# Patient Record
Sex: Male | Born: 1935 | Race: White | Hispanic: No | Marital: Married | Smoking: Former smoker
Health system: Southern US, Community
[De-identification: ages and names within clinical notes are randomized; demographics above are authoritative.]

## PROBLEM LIST (undated history)

## (undated) DIAGNOSIS — M4850XA Collapsed vertebra, not elsewhere classified, site unspecified, initial encounter for fracture: Secondary | ICD-10-CM

## (undated) DIAGNOSIS — R011 Cardiac murmur, unspecified: Secondary | ICD-10-CM

## (undated) DIAGNOSIS — R7303 Prediabetes: Secondary | ICD-10-CM

## (undated) DIAGNOSIS — K219 Gastro-esophageal reflux disease without esophagitis: Secondary | ICD-10-CM

## (undated) DIAGNOSIS — N400 Enlarged prostate without lower urinary tract symptoms: Secondary | ICD-10-CM

## (undated) DIAGNOSIS — M810 Age-related osteoporosis without current pathological fracture: Secondary | ICD-10-CM

## (undated) DIAGNOSIS — R0989 Other specified symptoms and signs involving the circulatory and respiratory systems: Secondary | ICD-10-CM

## (undated) DIAGNOSIS — I639 Cerebral infarction, unspecified: Secondary | ICD-10-CM

## (undated) DIAGNOSIS — N321 Vesicointestinal fistula: Secondary | ICD-10-CM

## (undated) DIAGNOSIS — I10 Essential (primary) hypertension: Secondary | ICD-10-CM

## (undated) DIAGNOSIS — N200 Calculus of kidney: Secondary | ICD-10-CM

## (undated) DIAGNOSIS — E785 Hyperlipidemia, unspecified: Secondary | ICD-10-CM

## (undated) HISTORY — DX: Gastro-esophageal reflux disease without esophagitis: K21.9

## (undated) HISTORY — DX: Calculus of kidney: N20.0

## (undated) HISTORY — DX: Benign prostatic hyperplasia without lower urinary tract symptoms: N40.0

## (undated) HISTORY — DX: Age-related osteoporosis without current pathological fracture: M81.0

## (undated) HISTORY — PX: TRANSURETHRAL RESECTION OF PROSTATE: SHX73

## (undated) HISTORY — DX: Prediabetes: R73.03

## (undated) HISTORY — PX: OTHER SURGICAL HISTORY: SHX169

## (undated) HISTORY — DX: Other specified symptoms and signs involving the circulatory and respiratory systems: R09.89

## (undated) HISTORY — DX: Hyperlipidemia, unspecified: E78.5

## (undated) HISTORY — DX: Vesicointestinal fistula: N32.1

## (undated) HISTORY — DX: Cardiac murmur, unspecified: R01.1

## (undated) HISTORY — PX: COLON SURGERY: SHX602

## (undated) HISTORY — DX: Essential (primary) hypertension: I10

## (undated) HISTORY — DX: Collapsed vertebra, not elsewhere classified, site unspecified, initial encounter for fracture: M48.50XA

## (undated) HISTORY — DX: Cerebral infarction, unspecified: I63.9

## (undated) HISTORY — PX: EYE SURGERY: SHX253

---

## 2005-08-14 ENCOUNTER — Ambulatory Visit: Payer: Self-pay | Admitting: Cardiology

## 2010-10-10 ENCOUNTER — Ambulatory Visit (HOSPITAL_COMMUNITY)
Admission: AD | Admit: 2010-10-10 | Discharge: 2010-10-10 | Payer: Self-pay | Source: Home / Self Care | Admitting: Urology

## 2011-03-14 LAB — CBC
HCT: 37.7 % — ABNORMAL LOW (ref 39.0–52.0)
MCV: 85.8 fL (ref 78.0–100.0)
RBC: 4.39 MIL/uL (ref 4.22–5.81)
WBC: 6.6 10*3/uL (ref 4.0–10.5)

## 2013-03-15 ENCOUNTER — Other Ambulatory Visit: Payer: Self-pay | Admitting: *Deleted

## 2013-04-02 ENCOUNTER — Other Ambulatory Visit: Payer: Self-pay | Admitting: *Deleted

## 2013-04-02 MED ORDER — MECLIZINE HCL 25 MG PO TABS: ORAL_TABLET | ORAL | Status: DC

## 2013-04-02 NOTE — Telephone Encounter (Signed)
Last seen 11/4

## 2013-05-06 ENCOUNTER — Other Ambulatory Visit: Payer: Self-pay

## 2013-05-06 NOTE — Telephone Encounter (Signed)
Last seen 01/26/13  last filled 02/19/13  If approved print and call patient to pick up

## 2013-05-07 ENCOUNTER — Ambulatory Visit (INDEPENDENT_AMBULATORY_CARE_PROVIDER_SITE_OTHER): Payer: Medicare Other | Admitting: Family Medicine

## 2013-05-07 ENCOUNTER — Encounter: Payer: Self-pay | Admitting: Family Medicine

## 2013-05-07 ENCOUNTER — Ambulatory Visit (INDEPENDENT_AMBULATORY_CARE_PROVIDER_SITE_OTHER): Payer: Medicare Other

## 2013-05-07 VITALS — BP 137/71 | HR 74 | Temp 97.0°F | Wt 152.2 lb

## 2013-05-07 DIAGNOSIS — R202 Paresthesia of skin: Secondary | ICD-10-CM

## 2013-05-07 DIAGNOSIS — R0989 Other specified symptoms and signs involving the circulatory and respiratory systems: Secondary | ICD-10-CM | POA: Insufficient documentation

## 2013-05-07 DIAGNOSIS — E785 Hyperlipidemia, unspecified: Secondary | ICD-10-CM | POA: Insufficient documentation

## 2013-05-07 DIAGNOSIS — G4700 Insomnia, unspecified: Secondary | ICD-10-CM

## 2013-05-07 DIAGNOSIS — R209 Unspecified disturbances of skin sensation: Secondary | ICD-10-CM

## 2013-05-07 DIAGNOSIS — I10 Essential (primary) hypertension: Secondary | ICD-10-CM

## 2013-05-07 DIAGNOSIS — M549 Dorsalgia, unspecified: Secondary | ICD-10-CM

## 2013-05-07 DIAGNOSIS — R7309 Other abnormal glucose: Secondary | ICD-10-CM

## 2013-05-07 DIAGNOSIS — M810 Age-related osteoporosis without current pathological fracture: Secondary | ICD-10-CM

## 2013-05-07 DIAGNOSIS — R7303 Prediabetes: Secondary | ICD-10-CM

## 2013-05-07 LAB — COMPLETE METABOLIC PANEL WITH GFR
ALT: 28 U/L (ref 0–53)
AST: 35 U/L (ref 0–37)
Albumin: 4.1 g/dL (ref 3.5–5.2)
Alkaline Phosphatase: 78 U/L (ref 39–117)
BUN: 24 mg/dL — ABNORMAL HIGH (ref 6–23)
CO2: 27 mEq/L (ref 19–32)
Calcium: 9.4 mg/dL (ref 8.4–10.5)
Chloride: 104 mEq/L (ref 96–112)
Creat: 1.17 mg/dL (ref 0.50–1.35)
GFR, Est African American: 68 mL/min
GFR, Est Non African American: 59 mL/min — ABNORMAL LOW
Glucose, Bld: 123 mg/dL — ABNORMAL HIGH (ref 70–99)
Potassium: 4.4 mEq/L (ref 3.5–5.3)
Sodium: 140 mEq/L (ref 135–145)
Total Bilirubin: 0.9 mg/dL (ref 0.3–1.2)
Total Protein: 6.5 g/dL (ref 6.0–8.3)

## 2013-05-07 LAB — FOLATE: Folate: >20 ng/mL

## 2013-05-07 LAB — TSH: TSH: 1.447 u[IU]/mL (ref 0.350–4.500)

## 2013-05-07 LAB — POCT GLYCOSYLATED HEMOGLOBIN (HGB A1C): Hemoglobin A1C: 5.8

## 2013-05-07 LAB — VITAMIN B12: Vitamin B-12: 475 pg/mL (ref 211–911)

## 2013-05-07 NOTE — Progress Notes (Signed)
Patient ID: Jose Hardin, male   DOB: Jun 01, 1930, 79 y.o.   MRN: [id number] SUBJECTIVE: HPI: Patient is here for follow up of hyperlipidemia/hypertension/right carotid bruit/prediabetes: denies Headache;denies Chest Pain;denies weakness;denies Shortness of Breath and orthopnea;denies Visual changes;denies palpitations;denies cough;denies pedal edema;denies symptoms of TIA or stroke;deniesClaudication symptoms. admits to Compliance with medications; denies Problems with medications.  Has had low back ache on and off. Has had pins and needles in the lateral side of the legs for several months.  PMH/PSH: reviewed/updated in Epic  SH/FH: reviewed/updated in Epic  Allergies: reviewed/updated in Epic  Medications: reviewed/updated in Epic  Immunizations: reviewed/updated in Epic  ROS: As above in the HPI. All other systems are stable or negative.  OBJECTIVE: APPEARANCE:  Patient in no acute distress.The patient appeared well nourished and normally developed. Acyanotic. Waist: VITAL SIGNS:BP 137/71  Pulse 74  Temp(Src) 97 F (36.1 C) (Oral)  Wt 152 lb 3.2 oz (69.037 kg) WM  SKIN: warm and  Dry without overt rashes, tattoos and scars  HEAD and Neck: without JVD, Head and scalp: normal Eyes:No scleral icterus. Fundi normal, eye movements normal. Ears: Auricle normal, canal normal, Tympanic membranes normal, insufflation normal. Nose: normal Throat: normal Neck & thyroid: normal  CHEST & LUNGS: Chest wall: normal Lungs: Clear  CVS: Reveals the PMI to be normally located. Regular rhythm, First and Second Heart sounds are normal,  absence of murmurs, rubs or gallops. Peripheral vasculature: Radial pulses: normal Dorsal pedis pulses: normal Posterior pulses: normal  ABDOMEN:  Appearance: normal Benign,, no organomegaly, no masses, no Abdominal Aortic enlargement. No Guarding , no rebound. No Bruits. Bowel sounds: normal  RECTAL: N/A GU: N/A  EXTREMETIES:  nonedematous. Both Femoral and Pedal pulses are normal.  MUSCULOSKELETAL:  Spine: FROM with mild  Discomfort in the lower lumbar spine on ROM. Joints: intact  NEUROLOGIC: oriented to time,place and person; nonfocal. Strength is normal Sensory issubjective pins and  Needles on the lateral legs bilaterally. Reflexes are normal Cranial Nerves are normal.  ASSESSMENT: Right carotid bruit - Plan: Vas Lab Arterial/Venous  HTN (hypertension) - Plan: COMPLETE METABOLIC PANEL WITH GFR  HLD (hyperlipidemia) - Plan: COMPLETE METABOLIC PANEL WITH GFR, NMR Lipoprofile with Lipids  Insomnia  Prediabetes - Plan: POCT glycosylated hemoglobin (Hb A1C), COMPLETE METABOLIC PANEL WITH GFR  Osteoporosis, unspecified  Back pain - Plan: DG Lumbar Spine 2-3 Views, NCV with EMG(electromyography)  Paresthesia of bilateral legs - Plan: Folate, Vitamin B12, TSH, DG Lumbar Spine 2-3 Views, NCV with EMG(electromyography)    PLAN:  [facility] reading (PRIMARY) by  Dr.Nhyla Nappi:Preliminary: L5 retrolisthesis with potential foraminal impingement. Await official reading.                  Orders Placed This Encounter  Procedures  . DG Lumbar Spine 2-3 Views    Standing Status: Future     Number of Occurrences: 1     Standing Expiration Date: 07/07/2014    Order Specific Question:  Reason for Exam (SYMPTOM  OR DIAGNOSIS REQUIRED)    Answer:  back pain with radiculopathy    Order Specific Question:  Preferred imaging location?    Answer:  Internal  . COMPLETE METABOLIC PANEL WITH GFR  . NMR Lipoprofile with Lipids  . Folate  . Vitamin B12  . TSH  . POCT glycosylated hemoglobin (Hb A1C)  . NCV with EMG(electromyography)    Standing Status: Future     Number of Occurrences:      Standing Expiration Date: 05/07/2014  . Vas  Lab Arterial/Venous    Standing Status: Future     Number of Occurrences:      Standing Expiration Date: 05/07/2014    Scheduling Instructions:     To do annually at [facility]  imaging.     Dx Right Carotid artery Bruit/ stenosis annual follow up    Order Specific Question:  Where should this test be performed?    Answer:  Other   Results for orders placed in visit on 05/07/13  POCT GLYCOSYLATED HEMOGLOBIN (HGB A1C)      Result Value Range   Hemoglobin A1C 5.8      Meds ordered this encounter  Medications  . alendronate (FOSAMAX) 70 MG tablet    Sig: Take 70 mg by mouth every 7 (seven) days.   Marland Kitchen amLODipine (NORVASC) 5 MG tablet    Sig: Take 5 mg by mouth daily.   Marland Kitchen atorvastatin (LIPITOR) 40 MG tablet    Sig: Take 40 mg by mouth daily.   . benazepril (LOTENSIN) 10 MG tablet    Sig: Take 10 mg by mouth daily.   . fluticasone (FLONASE) 50 MCG/ACT nasal spray    Sig: Place 1 spray into the nose daily.   . methotrexate (RHEUMATREX) 2.5 MG tablet    Sig: Take 2.5 mg by mouth. 2 tablets weekly  . omeprazole (PRILOSEC) 20 MG capsule    Sig: Take 20 mg by mouth daily.   . traMADol (ULTRAM) 50 MG tablet    Sig: Take 50 mg by mouth every 6 (six) hours as needed.   . zolpidem (AMBIEN) 10 MG tablet    Sig: Take 10 mg by mouth at bedtime as needed.    RTC in  3 months routinely.  Further interventions pending results.  Jassen Sarver P. [place] Charon, M.D.

## 2013-05-09 ENCOUNTER — Other Ambulatory Visit: Payer: Self-pay | Admitting: Family Medicine

## 2013-05-09 NOTE — Progress Notes (Signed)
Quick Note:  /Xray abnormal. Disk disease. Do the nerve conduction study. MRI lumbar spine needed. Ordered in EPIC. ______

## 2013-05-10 ENCOUNTER — Other Ambulatory Visit: Payer: Self-pay | Admitting: Family Medicine

## 2013-05-10 DIAGNOSIS — R0989 Other specified symptoms and signs involving the circulatory and respiratory systems: Secondary | ICD-10-CM

## 2013-05-10 MED ORDER — TRAMADOL HCL 50 MG PO TABS: ORAL_TABLET | ORAL | Status: DC

## 2013-05-10 NOTE — Telephone Encounter (Signed)
Printed

## 2013-05-11 ENCOUNTER — Other Ambulatory Visit: Payer: Self-pay | Admitting: Family Medicine

## 2013-05-11 DIAGNOSIS — M549 Dorsalgia, unspecified: Secondary | ICD-10-CM

## 2013-05-11 LAB — NMR LIPOPROFILE WITH LIPIDS
Cholesterol, Total: 114 mg/dL (ref <200)
HDL Particle Number: 32.2 umol/L (ref >=30.5)
HDL Size: 9.5 nm (ref >=9.2)
HDL-C: 44 mg/dL (ref >=40)
LDL (calc): 58 mg/dL (ref <100)
LDL Particle Number: 811 nmol/L (ref <1000)
LDL Size: 20.2 nm — ABNORMAL LOW (ref >20.5)
LP-IR Score: 47 — ABNORMAL HIGH (ref <=45)
Large HDL-P: 7.2 umol/L (ref >=4.8)
Large VLDL-P: 1.8 nmol/L (ref <=2.7)
Small LDL Particle Number: 456 nmol/L (ref <=527)
Triglycerides: 59 mg/dL (ref <150)
VLDL Size: 54.7 nm — ABNORMAL HIGH (ref <=46.6)

## 2013-05-11 NOTE — Progress Notes (Signed)
Patient says not to schedule the MRI on a Thursday. Thanks. FW

## 2013-05-11 NOTE — Progress Notes (Signed)
Quick Note:  Lab result at goal. No change in Medications for now. No Change in plans and follow up. ______ 

## 2013-05-12 ENCOUNTER — Ambulatory Visit (INDEPENDENT_AMBULATORY_CARE_PROVIDER_SITE_OTHER): Payer: Medicare Other

## 2013-05-12 ENCOUNTER — Ambulatory Visit: Payer: Self-pay

## 2013-05-12 ENCOUNTER — Encounter: Payer: Self-pay | Admitting: *Deleted

## 2013-05-12 DIAGNOSIS — M810 Age-related osteoporosis without current pathological fracture: Secondary | ICD-10-CM

## 2013-05-12 NOTE — Progress Notes (Signed)
Quick Note:  Letter of lab results sent to patient ______ 

## 2013-05-20 ENCOUNTER — Other Ambulatory Visit: Payer: Self-pay

## 2013-05-20 MED ORDER — FLUTICASONE PROPIONATE 50 MCG/ACT NA SUSP: 1.0000 | Freq: Every day | NASAL | Status: DC

## 2013-05-20 MED ORDER — ALENDRONATE SODIUM 70 MG PO TABS: 70.0000 mg | ORAL_TABLET | ORAL | Status: DC

## 2013-06-04 ENCOUNTER — Other Ambulatory Visit: Payer: Self-pay | Admitting: *Deleted

## 2013-06-04 MED ORDER — AMLODIPINE BESYLATE 5 MG PO TABS: 5.0000 mg | ORAL_TABLET | Freq: Every day | ORAL | Status: DC

## 2013-06-12 ENCOUNTER — Other Ambulatory Visit: Payer: Self-pay | Admitting: *Deleted

## 2013-06-12 MED ORDER — BENAZEPRIL HCL 10 MG PO TABS: 10.0000 mg | ORAL_TABLET | Freq: Every day | ORAL | Status: DC

## 2013-06-12 MED ORDER — ATORVASTATIN CALCIUM 40 MG PO TABS: 40.0000 mg | ORAL_TABLET | Freq: Every day | ORAL | Status: DC

## 2013-06-12 MED ORDER — OMEPRAZOLE 20 MG PO CPDR: 20.0000 mg | DELAYED_RELEASE_CAPSULE | Freq: Every day | ORAL | Status: DC

## 2013-07-19 ENCOUNTER — Other Ambulatory Visit: Payer: Self-pay | Admitting: *Deleted

## 2013-07-19 MED ORDER — TRAMADOL HCL 50 MG PO TABS: ORAL_TABLET | ORAL | Status: DC

## 2013-07-19 NOTE — Telephone Encounter (Signed)
LAST OV 05/07/13.

## 2013-07-19 NOTE — Telephone Encounter (Signed)
PLEASE PRINT LAST OV 05/07/13.

## 2013-08-20 ENCOUNTER — Encounter: Payer: Self-pay | Admitting: Family Medicine

## 2013-08-20 ENCOUNTER — Ambulatory Visit (INDEPENDENT_AMBULATORY_CARE_PROVIDER_SITE_OTHER): Payer: Medicare Other | Admitting: Family Medicine

## 2013-08-20 VITALS — BP 120/77 | HR 64 | Temp 97.0°F | Wt 148.4 lb

## 2013-08-20 DIAGNOSIS — M810 Age-related osteoporosis without current pathological fracture: Secondary | ICD-10-CM

## 2013-08-20 DIAGNOSIS — R011 Cardiac murmur, unspecified: Secondary | ICD-10-CM

## 2013-08-20 DIAGNOSIS — N400 Enlarged prostate without lower urinary tract symptoms: Secondary | ICD-10-CM | POA: Insufficient documentation

## 2013-08-20 DIAGNOSIS — N321 Vesicointestinal fistula: Secondary | ICD-10-CM | POA: Insufficient documentation

## 2013-08-20 DIAGNOSIS — G4700 Insomnia, unspecified: Secondary | ICD-10-CM

## 2013-08-20 DIAGNOSIS — E785 Hyperlipidemia, unspecified: Secondary | ICD-10-CM | POA: Insufficient documentation

## 2013-08-20 DIAGNOSIS — M549 Dorsalgia, unspecified: Secondary | ICD-10-CM

## 2013-08-20 DIAGNOSIS — R0989 Other specified symptoms and signs involving the circulatory and respiratory systems: Secondary | ICD-10-CM

## 2013-08-20 DIAGNOSIS — I10 Essential (primary) hypertension: Secondary | ICD-10-CM | POA: Insufficient documentation

## 2013-08-20 DIAGNOSIS — R7303 Prediabetes: Secondary | ICD-10-CM

## 2013-08-20 DIAGNOSIS — R7309 Other abnormal glucose: Secondary | ICD-10-CM

## 2013-08-20 DIAGNOSIS — K219 Gastro-esophageal reflux disease without esophagitis: Secondary | ICD-10-CM

## 2013-08-20 DIAGNOSIS — M4850XA Collapsed vertebra, not elsewhere classified, site unspecified, initial encounter for fracture: Secondary | ICD-10-CM | POA: Insufficient documentation

## 2013-08-20 DIAGNOSIS — IMO0001 Reserved for inherently not codable concepts without codable children: Secondary | ICD-10-CM

## 2013-08-20 MED ORDER — TRAMADOL HCL 50 MG PO TABS: ORAL_TABLET | ORAL | Status: DC

## 2013-08-20 NOTE — Progress Notes (Signed)
Patient ID: Jose Hardin, male   DOB: 10-Aug-1930, 79 y.o.   MRN: [id number] SUBJECTIVE: CC: Chief Complaint  Patient presents with  . Follow-up    3 month follow up   . Medication Refill    trammadol    HPI:  Patient is here for follow up of hyperlipidemia/hypertension/ osteoporosis/right carotid bruit. denies Headache;denies Chest Pain;denies weakness;denies Shortness of Breath and orthopnea;denies Visual changes;denies palpitations;denies cough;denies pedal edema;denies symptoms of TIA or stroke;deniesClaudication symptoms. admits to Compliance with medications; denies Problems with medications.  Back pain flare up needs some pain relief. 9/10. This is from the vertebral fracture in the past.   Past Medical History  Diagnosis Date  . Carotid bruit present   . Hypertension   . BPH (benign prostatic hyperplasia)   . Colovesical fistula   . Vertebral compression fracture   . Prediabetes   . Hyperlipidemia   . Osteoporosis   . Heart murmur   . GERD (gastroesophageal reflux disease)    Past Surgical History  Procedure Laterality Date  . Transurethral resection of prostate    . Colon surgery      sigmoid coloectomy  . Eye surgery      cataracts   History   Social History  . Marital Status: Single    Spouse Name: N/A    Number of Children: N/A  . Years of Education: N/A   Occupational History  . Not on file.   Social History Main Topics  . Smoking status: Former Smoker    Quit date: 12/30/1981  . Smokeless tobacco: Not on file  . Alcohol Use: Not on file  . Drug Use: Not on file  . Sexual Activity: Not on file   Other Topics Concern  . Not on file   Social History Narrative  . No narrative on file   No family history on file. Current Outpatient Prescriptions on File Prior to Visit  Medication Sig Dispense Refill  . alendronate (FOSAMAX) 70 MG tablet Take 1 tablet (70 mg total) by mouth every 7 (seven) days.  4 tablet  5  . amLODipine (NORVASC) 5 MG  tablet Take 1 tablet (5 mg total) by mouth daily.  30 tablet  2  . atorvastatin (LIPITOR) 40 MG tablet Take 1 tablet (40 mg total) by mouth daily.  30 tablet  4  . benazepril (LOTENSIN) 10 MG tablet Take 1 tablet (10 mg total) by mouth daily.  30 tablet  4  . fluticasone (FLONASE) 50 MCG/ACT nasal spray Place 1 spray into the nose daily.  16 g  4  . meclizine (ANTIVERT) 25 MG tablet Take one tablet three times daily for 10 days  30 tablet  0  . methotrexate (RHEUMATREX) 2.5 MG tablet Take 2.5 mg by mouth. 2 tablets weekly      . omeprazole (PRILOSEC) 20 MG capsule Take 1 capsule (20 mg total) by mouth daily.  30 capsule  4  . zolpidem (AMBIEN) 10 MG tablet Take 10 mg by mouth at bedtime as needed.        No current facility-administered medications on file prior to visit.   No Known Allergies Immunization History  Administered Date(s) Administered  . Pneumococcal Polysaccharide 12/30/2009  . Tdap 09/30/2011   Prior to Admission medications   Medication Sig Start Date End Date Taking? Authorizing Provider  alendronate (FOSAMAX) 70 MG tablet Take 1 tablet (70 mg total) by mouth every 7 (seven) days. 05/20/13  Yes Ileana Ladd, MD  amLODipine (NORVASC)  5 MG tablet Take 1 tablet (5 mg total) by mouth daily. 06/04/13  Yes Ileana Ladd, MD  atorvastatin (LIPITOR) 40 MG tablet Take 1 tablet (40 mg total) by mouth daily. 06/12/13  Yes Ileana Ladd, MD  benazepril (LOTENSIN) 10 MG tablet Take 1 tablet (10 mg total) by mouth daily. 06/12/13  Yes Ileana Ladd, MD  fluticasone (FLONASE) 50 MCG/ACT nasal spray Place 1 spray into the nose daily. 05/20/13  Yes Ileana Ladd, MD  meclizine (ANTIVERT) 25 MG tablet Take one tablet three times daily for 10 days 04/02/13  Yes Ileana Ladd, MD  methotrexate (RHEUMATREX) 2.5 MG tablet Take 2.5 mg by mouth. 2 tablets weekly 04/15/13  Yes Historical Provider, MD  omeprazole (PRILOSEC) 20 MG capsule Take 1 capsule (20 mg total) by mouth daily. 06/12/13  Yes  Ileana Ladd, MD  traMADol Janean Sark) 50 MG tablet Take one tablet twice a day 08/20/13  Yes Ileana Ladd, MD  zolpidem (AMBIEN) 10 MG tablet Take 10 mg by mouth at bedtime as needed.  01/28/13  Yes Historical Provider, MD     ROS: As above in the HPI. All other systems are stable or negative.  OBJECTIVE: APPEARANCE:  Patient in no acute distress.The patient appeared well nourished and normally developed. Acyanotic. Waist: VITAL SIGNS:BP 120/77  Pulse 64  Temp(Src) 97 F (36.1 C)  Wt 148 lb 6.4 oz (67.314 kg) WM  SKIN: warm and  Dry without overt rashes, tattoos and scars  HEAD and Neck: without JVD, Head and scalp: normal Eyes:No scleral icterus. Fundi normal, eye movements normal. Ears: Auricle normal, canal normal, Tympanic membranes normal, insufflation normal. Nose: normal Throat: normal Neck & thyroid: right carotid bruit  CHEST & LUNGS: Chest wall: normal Lungs: Clear  CVS: Reveals the PMI to be normally located. Regular rhythm, First and Second Heart sounds are normal,  2/6 sem at the left sternal border. Soft no change in murmurs, no rubs or gallops. Peripheral vasculature: Radial pulses: normal Dorsal pedis pulses: normal Posterior pulses: normal  ABDOMEN:  Appearance: normal Benign, no organomegaly, no masses, no Abdominal Aortic enlargement. No Guarding , no rebound. No Bruits. Bowel sounds: normal  RECTAL: N/A GU: N/A  EXTREMETIES: nonedematous.  MUSCULOSKELETAL:  Spine: reduced ROM. Pain in the lower thoracic spine area. Joints: intact  NEUROLOGIC: oriented to time,place and person; nonfocal. Strength is normal Sensory is normal Reflexes are normal Cranial Nerves are normal.  ASSESSMENT: Back pain - Plan: traMADol (ULTRAM) 50 MG tablet  Carotid bruit present  HLD (hyperlipidemia) - Plan: CMP14+EGFR, NMR, lipoprofile  HTN (hypertension) - Plan: CMP14+EGFR  Osteoporosis, unspecified - Plan: Vit D  25 hydroxy (rtn osteoporosis  monitoring), DG Bone Density  Prediabetes  Right carotid bruit  Insomnia  Vertebral compression fracture, sequela - Plan: traMADol (ULTRAM) 50 MG tablet  Heart murmur  GERD (gastroesophageal reflux disease)   PLAN: Patient says he is not yet due for follow up in [place] for his carotid bruit.  Also, attends the [place] [facility] and receives the [place] for sleep.  Orders Placed This Encounter  Procedures  . DG Bone Density    Standing Status: Future     Number of Occurrences:      Standing Expiration Date: 10/20/2014    Order Specific Question:  Reason for Exam (SYMPTOM  OR DIAGNOSIS REQUIRED)    Answer:  osteoporosis    Order Specific Question:  Preferred imaging location?    Answer:  Internal  . CMP14+EGFR  .  NMR, lipoprofile  . Vit D  25 hydroxy (rtn osteoporosis monitoring)  . POCT glycosylated hemoglobin (Hb A1C)     Meds ordered this encounter  Medications  . traMADol (ULTRAM) 50 MG tablet    Sig: Take one tablet twice a day    Dispense:  60 tablet    Refill:  0    Medications Discontinued During This Encounter  Medication Reason  . traMADol (ULTRAM) 50 MG tablet Duplicate  . traMADol (ULTRAM) 50 MG tablet Reorder    Results for orders placed in visit on 05/07/13  COMPLETE METABOLIC PANEL WITH GFR      Result Value Range   Sodium 140  135 - 145 mEq/L   Potassium 4.4  3.5 - 5.3 mEq/L   Chloride 104  96 - 112 mEq/L   CO2 27  19 - 32 mEq/L   Glucose, Bld 123 (*) 70 - 99 mg/dL   BUN 24 (*) 6 - 23 mg/dL   Creat 4.09  8.11 - 9.14 mg/dL   Total Bilirubin 0.9  0.3 - 1.2 mg/dL   Alkaline Phosphatase 78  39 - 117 U/L   AST 35  0 - 37 U/L   ALT 28  0 - 53 U/L   Total Protein 6.5  6.0 - 8.3 g/dL   Albumin 4.1  3.5 - 5.2 g/dL   Calcium 9.4  8.4 - 78.2 mg/dL   GFR, Est African American 68     GFR, Est Non African American 59 (*)   NMR LIPOPROFILE WITH LIPIDS      Result Value Range   LDL Particle Number 811  <1000 nmol/L   LDL (calc) 58  <100 mg/dL   HDL-C 44   >=95 mg/dL   Triglycerides 59  <621 mg/dL   Cholesterol, Total 308  <200 mg/dL   HDL Particle Number 65.7  >=84.6 umol/L   Large HDL-P 7.2  >=4.8 umol/L   Large VLDL-P 1.8  <=2.7 nmol/L   Small LDL Particle Number 456  <=527 nmol/L   LDL Size 20.2 (*) >20.5 nm   HDL Size 9.5  >=9.2 nm   VLDL Size 54.7 (*) <=46.6 nm   LP-IR Score 47 (*) <=45  FOLATE      Result Value Range   Folate >20.0    VITAMIN B12      Result Value Range   Vitamin B-12 475  211 - 911 pg/mL  TSH      Result Value Range   TSH 1.447  0.350 - 4.500 uIU/mL  POCT GLYCOSYLATED HEMOGLOBIN (HGB A1C)      Result Value Range   Hemoglobin A1C 5.8      Reviewed previous labs.  Reviewed meds with patient.  I could not find a follow up DEXA since 2009 and therefore will schedule one.  Return in about 4 months (around 12/20/2013) for Recheck medical problems.  Lousie Calico P. [place] Charon, M.D.

## 2013-08-23 LAB — CMP14+EGFR
ALT: 23 IU/L (ref 0–44)
AST: 34 IU/L (ref 0–40)
Albumin/Globulin Ratio: 2.2 (ref 1.1–2.5)
Albumin: 4.3 g/dL (ref 3.5–4.7)
Alkaline Phosphatase: 72 IU/L (ref 39–117)
BUN/Creatinine Ratio: 17 (ref 10–22)
BUN: 19 mg/dL (ref 8–27)
CO2: 26 mmol/L (ref 18–29)
Calcium: 9.3 mg/dL (ref 8.6–10.2)
Chloride: 102 mmol/L (ref 97–108)
Creatinine, Ser: 1.15 mg/dL (ref 0.76–1.27)
GFR calc Af Amer: 69 mL/min/{1.73_m2} (ref >59)
GFR calc non Af Amer: 60 mL/min/{1.73_m2} (ref >59)
Globulin, Total: 2 g/dL (ref 1.5–4.5)
Glucose: 123 mg/dL — ABNORMAL HIGH (ref 65–99)
Potassium: 4.3 mmol/L (ref 3.5–5.2)
Sodium: 140 mmol/L (ref 134–144)
Total Bilirubin: 0.8 mg/dL (ref 0.0–1.2)
Total Protein: 6.3 g/dL (ref 6.0–8.5)

## 2013-08-23 LAB — VITAMIN D 25 HYDROXY (VIT D DEFICIENCY, FRACTURES): Vit D, 25-Hydroxy: 49.9 ng/mL (ref 30.0–100.0)

## 2013-08-23 LAB — NMR, LIPOPROFILE
Cholesterol: 114 mg/dL (ref <200)
HDL Cholesterol by NMR: 50 mg/dL (ref >=40)
HDL Particle Number: 32.7 umol/L (ref >=30.5)
LDL Particle Number: 860 nmol/L (ref <1000)
LDL Size: 21.1 nm (ref >20.5)
LDLC SERPL CALC-MCNC: 53 mg/dL (ref <100)
LP-IR Score: <25 (ref <=45)
Small LDL Particle Number: <90 nmol/L (ref <=527)
Triglycerides by NMR: 56 mg/dL (ref <150)

## 2013-08-27 NOTE — Progress Notes (Signed)
Quick Note:  Labs abnormal.The HGBA1C is missing!!!! The Rest of the labs Seem to be at goal pending the HGBA1C ______

## 2013-09-02 LAB — POCT GLYCOSYLATED HEMOGLOBIN (HGB A1C): Hemoglobin A1C: 5.7

## 2013-09-03 ENCOUNTER — Other Ambulatory Visit: Payer: Self-pay | Admitting: *Deleted

## 2013-09-03 MED ORDER — MECLIZINE HCL 25 MG PO TABS: ORAL_TABLET | ORAL | Status: DC

## 2013-09-29 ENCOUNTER — Ambulatory Visit (INDEPENDENT_AMBULATORY_CARE_PROVIDER_SITE_OTHER): Payer: Medicare Other | Admitting: Pharmacist

## 2013-09-29 ENCOUNTER — Ambulatory Visit (INDEPENDENT_AMBULATORY_CARE_PROVIDER_SITE_OTHER): Payer: Medicare Other

## 2013-09-29 ENCOUNTER — Encounter: Payer: Self-pay | Admitting: Pharmacist

## 2013-09-29 VITALS — Ht 69.5 in | Wt 154.0 lb

## 2013-09-29 DIAGNOSIS — M810 Age-related osteoporosis without current pathological fracture: Secondary | ICD-10-CM

## 2013-09-29 DIAGNOSIS — Z23 Encounter for immunization: Secondary | ICD-10-CM

## 2013-09-29 MED ORDER — LIDOCAINE 5 % EX PTCH: MEDICATED_PATCH | CUTANEOUS | Status: DC

## 2013-09-29 NOTE — Progress Notes (Signed)
Patient ID: Jose Hardin, male   DOB: 10/09/1930, 79 y.o.   MRN: [id number] [facility] Current Height: Height: 5' 9.5" (176.5 cm)      Max Lifetime Height:  5\' 11"  Current Weight: Weight: 154 lb (69.854 kg)       Ethnicity:Caucasian    HPI: Does pt already have a diagnosis of:  Osteopenia?  Yes Osteoporosis?  Yes  Back Pain?  Yes       Kyphosis?  Yes Prior fracture?  Yes vertebral fracture in 1960's when power pole fell on him at work Per Lumbar Xray from 04/2013 - Anterior wedge compression deformity T12 (?new vs. Old fracture)  Med(s) for Osteoporosis/Osteopenia:  Fosamax 70mg  1 tablet weekly Med(s) previously tried for Osteoporosis/Osteopenia:  none                                                            PMH: Age at menopause:  n/a  Steroid Use?  No Thyroid med?  No History of cancer?  No History of digestive disorders (ie Crohn's)?  Yes - GERD on PPI Current or previous eating disorders?  No Last Vitamin D Result:  56 (12/2012) Last GFR Result:  60 (08/20/2013)   FH/SH: Family history of osteoporosis?  No Parent with history of hip fracture?  No Exercise?  No - active lifestyle, does all housework and up keep to home Smoking?  No Alcohol?  No    Calcium Assessment Calcium Intake  # of servings/day  Calcium mg  Milk (8 oz) 1  x  300  = 300mg   Yogurt (4 oz) 0 x  200 = 0  Cheese (1 oz) 1 x  200 = 200mg   Other Calcium sources   250mg   Ca supplement MVI = 400mg    Estimated calcium intake per day 1150mg     DEXA Results Date of Test T-Score for AP Spine L1-L4 T-Score for Total Left Hip T-Score for Total Right Hip  09/29/2013 -0.9 -0.4 -0.8  05/12/2013 -0.6 -0.4 -0.8  06/08/2008 -1.1 -0.7 -1.2         Assessment: Osteopenia with past history of vertebral fracture due to servere trauma C/o continued chronic back pain not adequately relieved with tramadol  Recommendations: 1.  Continue alendronate (FOSAMAX) 70mg  weekly 2.  recommend calcium 1200mg   daily through supplementation or diet.  3.  recommend weight bearing exercise - 30 minutes at least 4 days per week as able 4.  Start lidoderm patches - apply 1 patch to area of pain for 12 hours then remove and discard.  May reapply as needed after 12 hours off.    4.  Counseled and educated about fall risk and prevention.  Recheck DEXA:  2 years  Time spent counseling patient:  25 minutes

## 2013-10-01 ENCOUNTER — Other Ambulatory Visit: Payer: Self-pay | Admitting: *Deleted

## 2013-10-01 DIAGNOSIS — M549 Dorsalgia, unspecified: Secondary | ICD-10-CM

## 2013-10-01 MED ORDER — TRAMADOL HCL 50 MG PO TABS: ORAL_TABLET | ORAL | Status: DC

## 2013-10-01 NOTE — Telephone Encounter (Signed)
Last seen 08/20/13, last filled 08/26/13. If approved route to pool A so it can be called into [place] [phone]

## 2013-10-01 NOTE — Telephone Encounter (Signed)
Rx ready for Phone in. 

## 2013-10-02 NOTE — Telephone Encounter (Signed)
rx for in for tramadol called to [facility]

## 2013-10-15 ENCOUNTER — Other Ambulatory Visit: Payer: Self-pay | Admitting: Family Medicine

## 2013-11-01 ENCOUNTER — Encounter: Payer: Self-pay | Admitting: General Practice

## 2013-11-01 ENCOUNTER — Encounter (INDEPENDENT_AMBULATORY_CARE_PROVIDER_SITE_OTHER): Payer: Self-pay

## 2013-11-01 ENCOUNTER — Telehealth: Payer: Self-pay | Admitting: Family Medicine

## 2013-11-01 ENCOUNTER — Ambulatory Visit (INDEPENDENT_AMBULATORY_CARE_PROVIDER_SITE_OTHER): Payer: Medicare Other | Admitting: General Practice

## 2013-11-01 VITALS — BP 131/64 | HR 83 | Temp 97.4°F | Ht 69.5 in | Wt 151.0 lb

## 2013-11-01 DIAGNOSIS — J322 Chronic ethmoidal sinusitis: Secondary | ICD-10-CM

## 2013-11-01 DIAGNOSIS — Z872 Personal history of diseases of the skin and subcutaneous tissue: Secondary | ICD-10-CM

## 2013-11-01 DIAGNOSIS — J029 Acute pharyngitis, unspecified: Secondary | ICD-10-CM

## 2013-11-01 DIAGNOSIS — Z8619 Personal history of other infectious and parasitic diseases: Secondary | ICD-10-CM

## 2013-11-01 LAB — POCT INFLUENZA A/B
Influenza A, POC: NEGATIVE
Influenza B, POC: NEGATIVE

## 2013-11-01 MED ORDER — AZITHROMYCIN 250 MG PO TABS: ORAL_TABLET | ORAL | Status: DC

## 2013-11-01 MED ORDER — CLOTRIMAZOLE-BETAMETHASONE 1-0.05 % EX CREA: TOPICAL_CREAM | Freq: Two times a day (BID) | CUTANEOUS | Status: DC

## 2013-11-01 NOTE — Progress Notes (Signed)
  Subjective:    Patient ID: Jose Hardin, male    DOB: 1930/04/21, 79 y.o.   MRN: [id number]  Sinusitis This is a new problem. The current episode started in the past 7 days. The problem has been gradually worsening since onset. There has been no fever. Associated symptoms include congestion, sinus pressure, sneezing and a sore throat. Pertinent negatives include no chills, headaches or shortness of breath. Past treatments include nothing.       Review of Systems  Constitutional: Negative for fever and chills.  HENT: Positive for congestion, sinus pressure, sneezing and sore throat.   Respiratory: Negative for chest tightness and shortness of breath.   Cardiovascular: Negative for chest pain and palpitations.  Neurological: Negative for dizziness, weakness and headaches.       Objective:   Physical Exam  Constitutional: He is oriented to person, place, and time. He appears well-developed and well-nourished.  HENT:  Head: Normocephalic and atraumatic.  Right Ear: External ear normal.  Left Ear: External ear normal.  Nose: Right sinus exhibits maxillary sinus tenderness. Left sinus exhibits maxillary sinus tenderness.  Mouth/Throat: Posterior oropharyngeal erythema present.  Cardiovascular: Normal rate, regular rhythm and normal heart sounds.   Pulmonary/Chest: Effort normal and breath sounds normal.  Neurological: He is alert and oriented to person, place, and time.  Skin: Skin is warm and dry.  Psychiatric: He has a normal mood and affect.     Results for orders placed in visit on 11/01/13  POCT RAPID STREP A (OFFICE)      Result Value Range   Rapid Strep A Screen Negative  Negative  POCT INFLUENZA A/B      Result Value Range   Influenza A, POC Negative     Influenza B, POC Negative          Assessment & Plan:  1. Sore throat  - POCT rapid strep A - POCT Influenza A/B  2. Ethmoid sinusitis  - azithromycin (ZITHROMAX) 250 MG tablet; Take as directed  Dispense: 6  tablet; Refill: 0  3. History of fungal skin infection  - clotrimazole-betamethasone (LOTRISONE) cream; Apply topically 2 (two) times daily.  Dispense: 30 g; Refill: 1 -increase fluids -RTO if symptoms worsen or unresolved -Patient verbalized understanding -Coralie Keens, FNP-C

## 2013-11-01 NOTE — Patient Instructions (Signed)

## 2013-11-01 NOTE — Telephone Encounter (Signed)
Pt needs to be seen for sore throat appt scheduled

## 2013-11-30 ENCOUNTER — Ambulatory Visit (INDEPENDENT_AMBULATORY_CARE_PROVIDER_SITE_OTHER): Payer: Medicare Other | Admitting: Family Medicine

## 2013-11-30 ENCOUNTER — Encounter: Payer: Self-pay | Admitting: Family Medicine

## 2013-11-30 VITALS — BP 151/68 | HR 68 | Temp 97.1°F | Ht 70.0 in | Wt 151.8 lb

## 2013-11-30 DIAGNOSIS — IMO0002 Reserved for concepts with insufficient information to code with codable children: Secondary | ICD-10-CM

## 2013-11-30 DIAGNOSIS — R209 Unspecified disturbances of skin sensation: Secondary | ICD-10-CM

## 2013-11-30 DIAGNOSIS — M8448XD Pathological fracture, other site, subsequent encounter for fracture with routine healing: Secondary | ICD-10-CM

## 2013-11-30 DIAGNOSIS — E785 Hyperlipidemia, unspecified: Secondary | ICD-10-CM

## 2013-11-30 DIAGNOSIS — R7303 Prediabetes: Secondary | ICD-10-CM

## 2013-11-30 DIAGNOSIS — M549 Dorsalgia, unspecified: Secondary | ICD-10-CM

## 2013-11-30 DIAGNOSIS — R7309 Other abnormal glucose: Secondary | ICD-10-CM

## 2013-11-30 DIAGNOSIS — R0989 Other specified symptoms and signs involving the circulatory and respiratory systems: Secondary | ICD-10-CM

## 2013-11-30 DIAGNOSIS — R011 Cardiac murmur, unspecified: Secondary | ICD-10-CM

## 2013-11-30 DIAGNOSIS — K219 Gastro-esophageal reflux disease without esophagitis: Secondary | ICD-10-CM

## 2013-11-30 DIAGNOSIS — IMO0001 Reserved for inherently not codable concepts without codable children: Secondary | ICD-10-CM

## 2013-11-30 DIAGNOSIS — I10 Essential (primary) hypertension: Secondary | ICD-10-CM

## 2013-11-30 DIAGNOSIS — M810 Age-related osteoporosis without current pathological fracture: Secondary | ICD-10-CM

## 2013-11-30 DIAGNOSIS — N400 Enlarged prostate without lower urinary tract symptoms: Secondary | ICD-10-CM

## 2013-11-30 DIAGNOSIS — G4700 Insomnia, unspecified: Secondary | ICD-10-CM

## 2013-11-30 DIAGNOSIS — R202 Paresthesia of skin: Secondary | ICD-10-CM

## 2013-11-30 LAB — POCT GLYCOSYLATED HEMOGLOBIN (HGB A1C): Hemoglobin A1C: 5.7

## 2013-11-30 LAB — POCT UA - MICROALBUMIN: Microalbumin Ur, POC: 20 mg/L

## 2013-11-30 MED ORDER — TRAMADOL HCL 50 MG PO TABS: ORAL_TABLET | ORAL | Status: DC

## 2013-11-30 NOTE — Progress Notes (Signed)
Patient ID: NAS WAFER, male   DOB: June 07, 1930, 79 y.o.   MRN: [id number] SUBJECTIVE: CC: Chief Complaint  Patient presents with  . Follow-up    3 month ck up states his insurance denied MRI     HPI:  Patient is here for follow up of hyperlipidemia/HTN/prediabetes: denies Headache;denies Chest Pain;denies weakness;denies Shortness of Breath and orthopnea;denies Visual changes;denies palpitations;denies cough;denies pedal edema;denies symptoms of TIA or stroke;deniesClaudication symptoms. admits to Compliance with medications; denies Problems with medications.   Past Medical History  Diagnosis Date  . Carotid bruit present   . Hypertension   . BPH (benign prostatic hyperplasia)   . Colovesical fistula   . Vertebral compression fracture   . Prediabetes   . Hyperlipidemia   . Osteoporosis   . Heart murmur   . GERD (gastroesophageal reflux disease)    Past Surgical History  Procedure Laterality Date  . Transurethral resection of prostate    . Colon surgery      sigmoid coloectomy  . Eye surgery      cataracts   History   Social History  . Marital Status: Single    Spouse Name: N/A    Number of Children: N/A  . Years of Education: N/A   Occupational History  . Not on file.   Social History Main Topics  . Smoking status: Former Smoker    Quit date: 12/30/1981  . Smokeless tobacco: Not on file  . Alcohol Use: Not on file  . Drug Use: Not on file  . Sexual Activity: Not on file   Other Topics Concern  . Not on file   Social History Narrative  . No narrative on file   No family history on file. Current Outpatient Prescriptions on File Prior to Visit  Medication Sig Dispense Refill  . alendronate (FOSAMAX) 70 MG tablet Take 1 tablet (70 mg total) by mouth every 7 (seven) days.  4 tablet  5  . amLODipine (NORVASC) 5 MG tablet TAKE 1 TABLET ONCE DAILY.  30 tablet  3  . aspirin 325 MG tablet Take 325 mg by mouth daily.      Marland Kitchen atorvastatin (LIPITOR) 40 MG  tablet Take 1 tablet (40 mg total) by mouth daily.  30 tablet  4  . benazepril (LOTENSIN) 10 MG tablet Take 1 tablet (10 mg total) by mouth daily.  30 tablet  4  . cholecalciferol (VITAMIN D) 1000 UNITS tablet Take 1,000 Units by mouth daily.      . clotrimazole-betamethasone (LOTRISONE) cream Apply topically 2 (two) times daily.  30 g  1  . fluticasone (FLONASE) 50 MCG/ACT nasal spray Place 1 spray into the nose daily.  16 g  4  . lidocaine (LIDODERM) 5 % Apply 1 patch to area of pain.  After 12 hours Remove & Discard patch.  Wait 12 hours and then may apply another patch.  30 patch  2  . meclizine (ANTIVERT) 25 MG tablet Take one tablet three times daily for 10 days  30 tablet  0  . methotrexate (RHEUMATREX) 2.5 MG tablet Take 2.5 mg by mouth. 2 tablets weekly      . Multiple Vitamin (MULTI-VITAMIN DAILY PO) Take 1 tablet by mouth daily.      Marland Kitchen omeprazole (PRILOSEC) 20 MG capsule Take 1 capsule (20 mg total) by mouth daily.  30 capsule  4  . zolpidem (AMBIEN) 10 MG tablet Take 10 mg by mouth at bedtime as needed.  No current facility-administered medications on file prior to visit.   No Known Allergies Immunization History  Administered Date(s) Administered  . Influenza,inj,Quad PF,36+ Mos 09/29/2013  . Pneumococcal Polysaccharide-23 12/30/2009  . Tdap 09/30/2011   Prior to Admission medications   Medication Sig Start Date End Date Taking? Authorizing Provider  alendronate (FOSAMAX) 70 MG tablet Take 1 tablet (70 mg total) by mouth every 7 (seven) days. 05/20/13   Ileana Ladd, MD  amLODipine (NORVASC) 5 MG tablet TAKE 1 TABLET ONCE DAILY. 10/15/13   Ileana Ladd, MD  aspirin 325 MG tablet Take 325 mg by mouth daily.    Historical Provider, MD  atorvastatin (LIPITOR) 40 MG tablet Take 1 tablet (40 mg total) by mouth daily. 06/12/13   Ileana Ladd, MD  azithromycin (ZITHROMAX) 250 MG tablet Take as directed 11/01/13   Coralie Keens, FNP  benazepril (LOTENSIN) 10 MG tablet  Take 1 tablet (10 mg total) by mouth daily. 06/12/13   Ileana Ladd, MD  cholecalciferol (VITAMIN D) 1000 UNITS tablet Take 1,000 Units by mouth daily.    Historical Provider, MD  clotrimazole-betamethasone (LOTRISONE) cream Apply topically 2 (two) times daily. 11/01/13   Coralie Keens, FNP  fluticasone (FLONASE) 50 MCG/ACT nasal spray Place 1 spray into the nose daily. 05/20/13   Ileana Ladd, MD  lidocaine (LIDODERM) 5 % Apply 1 patch to area of pain.  After 12 hours Remove & Discard patch.  Wait 12 hours and then may apply another patch. 09/29/13   Ileana Ladd, MD  meclizine (ANTIVERT) 25 MG tablet Take one tablet three times daily for 10 days 09/03/13   Ileana Ladd, MD  methotrexate (RHEUMATREX) 2.5 MG tablet Take 2.5 mg by mouth. 2 tablets weekly 04/15/13   Historical Provider, MD  Multiple Vitamin (MULTI-VITAMIN DAILY PO) Take 1 tablet by mouth daily.    Historical Provider, MD  omeprazole (PRILOSEC) 20 MG capsule Take 1 capsule (20 mg total) by mouth daily. 06/12/13   Ileana Ladd, MD  traMADol Janean Sark) 50 MG tablet Take one tablet twice a day 10/01/13   Ileana Ladd, MD  zolpidem (AMBIEN) 10 MG tablet Take 10 mg by mouth at bedtime as needed.  01/28/13   Historical Provider, MD     ROS: As above in the HPI. All other systems are stable or negative.  OBJECTIVE: APPEARANCE:  Patient in no acute distress.The patient appeared well nourished and normally developed. Acyanotic. Waist: VITAL SIGNS:BP 151/68  Pulse 68  Temp(Src) 97.1 F (36.2 C) (Oral)  Ht 5\' 10"  (1.778 m)  Wt 151 lb 12.8 oz (68.856 kg)  BMI 21.78 kg/m2  Elderly WM SKIN: warm and  Dry without overt rashes, tattoos and scars  HEAD and Neck: without JVD, Head and scalp: normal Eyes:No scleral icterus. Fundi normal, eye movements normal. Ears: Auricle normal, canal normal, Tympanic membranes normal, insufflation normal. Nose: normal Throat: normal Neck & thyroid: normal Right Carotid bruit.  CHEST &  LUNGS: Chest wall: normal Lungs: Clear  CVS: Reveals the PMI to be normally located. Regular rhythm, First and Second Heart sounds are normal,  absence of murmurs, rubs or gallops. Peripheral vasculature: Radial pulses: normal Dorsal pedis pulses: reduced Posterior pulses: reduced  ABDOMEN:  Appearance: normal Benign, no organomegaly, no masses, no Abdominal Aortic enlargement. No Guarding , no rebound. No Bruits. Bowel sounds: normal  RECTAL: N/A GU: N/A  EXTREMETIES: nonedematous.  MUSCULOSKELETAL:  Spine: reduced ROM with discomfort Joints: intact  NEUROLOGIC:  oriented to time,place and person; nonfocal. Strength is normal Sensory: radicular pain reproducible. Reflexes in lower extremities are reduced. Cranial Nerves are normal. Results for orders placed in visit on 11/30/13  POCT GLYCOSYLATED HEMOGLOBIN (HGB A1C)      Result Value Range   Hemoglobin A1C 5.7    POCT UA - MICROALBUMIN      Result Value Range   Microalbumin Ur, POC 20      ASSESSMENT: Carotid bruit present  HLD (hyperlipidemia) - Plan: NMR, lipoprofile  HTN (hypertension)  Prediabetes - Plan: POCT glycosylated hemoglobin (Hb A1C), CMP14+EGFR, POCT UA - Microalbumin, Microalbumin, urine  Right carotid bruit  Back pain - Plan: traMADol (ULTRAM) 50 MG tablet, CBC With differential/Platelet  GERD (gastroesophageal reflux disease)  Heart murmur  Insomnia  Vertebral compression fracture, with routine healing, subsequent encounter - Plan: traMADol (ULTRAM) 50 MG tablet  Paresthesia of bilateral legs - Plan: CANCELED: POCT CBC  Osteoporosis - Plan: Vit D  25 hydroxy (rtn osteoporosis monitoring)  BPH (benign prostatic hyperplasia)  Vertebral compression fracture, sequela - Plan: traMADol (ULTRAM) 50 MG tablet  PLAN: Patient also attends the [place] [facility] and will probably be able to get his radicular back problem evaluated there easier and also the carotiud stenosis follow up as well. He  will discuss this and make an appointment for the [place].  Orders Placed This Encounter  Procedures  . CMP14+EGFR  . NMR, lipoprofile  . Vit D  25 hydroxy (rtn osteoporosis monitoring)  . CBC With differential/Platelet  . Microalbumin, urine  . POCT glycosylated hemoglobin (Hb A1C)  . POCT UA - Microalbumin   Meds ordered this encounter  Medications  . traMADol (ULTRAM) 50 MG tablet    Sig: Take one tablet twice a day    Dispense:  60 tablet    Refill:  0   Medications Discontinued During This Encounter  Medication Reason  . azithromycin (ZITHROMAX) 250 MG tablet Completed Course  . traMADol (ULTRAM) 50 MG tablet Reorder   Return in about 3 months (around 02/28/2014) for Recheck medical problems.  Dim Meisinger P. [place] Charon, M.D.

## 2013-12-01 LAB — MICROALBUMIN, URINE: Microalbumin, Urine: 28.5 ug/mL — ABNORMAL HIGH (ref 0.0–17.0)

## 2013-12-02 LAB — CMP14+EGFR
ALT: 17 IU/L (ref 0–44)
AST: 35 IU/L (ref 0–40)
Albumin/Globulin Ratio: 2.4 (ref 1.1–2.5)
Albumin: 4.4 g/dL (ref 3.5–4.7)
Alkaline Phosphatase: 73 IU/L (ref 39–117)
BUN/Creatinine Ratio: 16 (ref 10–22)
BUN: 19 mg/dL (ref 8–27)
CO2: 24 mmol/L (ref 18–29)
Calcium: 9.6 mg/dL (ref 8.6–10.2)
Chloride: 101 mmol/L (ref 97–108)
Creatinine, Ser: 1.2 mg/dL (ref 0.76–1.27)
GFR calc Af Amer: 65 mL/min/{1.73_m2} (ref >59)
GFR calc non Af Amer: 56 mL/min/{1.73_m2} — ABNORMAL LOW (ref >59)
Globulin, Total: 1.8 g/dL (ref 1.5–4.5)
Glucose: 122 mg/dL — ABNORMAL HIGH (ref 65–99)
Potassium: 4.5 mmol/L (ref 3.5–5.2)
Sodium: 143 mmol/L (ref 134–144)
Total Bilirubin: 1 mg/dL (ref 0.0–1.2)
Total Protein: 6.2 g/dL (ref 6.0–8.5)

## 2013-12-02 LAB — NMR, LIPOPROFILE
Cholesterol: 123 mg/dL (ref <200)
HDL Cholesterol by NMR: 42 mg/dL (ref >=40)
HDL Particle Number: 33.1 umol/L (ref >=30.5)
LDL Particle Number: 1097 nmol/L — ABNORMAL HIGH (ref <1000)
LDL Size: 20.1 nm — ABNORMAL LOW (ref >20.5)
LDLC SERPL CALC-MCNC: 61 mg/dL (ref <100)
LP-IR Score: 58 — ABNORMAL HIGH (ref <=45)
Small LDL Particle Number: 690 nmol/L — ABNORMAL HIGH (ref <=527)
Triglycerides by NMR: 102 mg/dL (ref <150)

## 2013-12-02 LAB — CBC WITH DIFFERENTIAL
Basophils Absolute: 0.1 10*3/uL (ref 0.0–0.2)
Basos: 1 %
Eos: 3 %
Eosinophils Absolute: 0.2 10*3/uL (ref 0.0–0.4)
HCT: 40.1 % (ref 37.5–51.0)
Hemoglobin: 13.9 g/dL (ref 12.6–17.7)
Immature Grans (Abs): 0 10*3/uL (ref 0.0–0.1)
Immature Granulocytes: 0 %
Lymphocytes Absolute: 1.3 10*3/uL (ref 0.7–3.1)
Lymphs: 24 %
MCH: 29.2 pg (ref 26.6–33.0)
MCHC: 34.7 g/dL (ref 31.5–35.7)
MCV: 84 fL (ref 79–97)
Monocytes Absolute: 0.4 10*3/uL (ref 0.1–0.9)
Monocytes: 8 %
Neutrophils Absolute: 3.5 10*3/uL (ref 1.4–7.0)
Neutrophils Relative %: 64 %
Platelets: 190 10*3/uL (ref 150–379)
RBC: 4.76 x10E6/uL (ref 4.14–5.80)
RDW: 13.5 % (ref 12.3–15.4)
WBC: 5.4 10*3/uL (ref 3.4–10.8)

## 2013-12-02 LAB — VITAMIN D 25 HYDROXY (VIT D DEFICIENCY, FRACTURES): Vit D, 25-Hydroxy: 53 ng/mL (ref 30.0–100.0)

## 2013-12-06 ENCOUNTER — Encounter: Payer: Self-pay | Admitting: *Deleted

## 2013-12-06 ENCOUNTER — Other Ambulatory Visit: Payer: Self-pay | Admitting: *Deleted

## 2013-12-06 MED ORDER — ALENDRONATE SODIUM 70 MG PO TABS: 70.0000 mg | ORAL_TABLET | ORAL | Status: DC

## 2013-12-06 NOTE — Progress Notes (Signed)
Quick Note:  Copy of labs sent to patient ______ 

## 2013-12-09 ENCOUNTER — Other Ambulatory Visit: Payer: Self-pay | Admitting: Family Medicine

## 2013-12-20 ENCOUNTER — Other Ambulatory Visit: Payer: Self-pay

## 2013-12-20 MED ORDER — BENAZEPRIL HCL 10 MG PO TABS: 10.0000 mg | ORAL_TABLET | Freq: Every day | ORAL | Status: DC

## 2013-12-21 ENCOUNTER — Encounter: Payer: Self-pay | Admitting: Family Medicine

## 2013-12-21 ENCOUNTER — Ambulatory Visit (INDEPENDENT_AMBULATORY_CARE_PROVIDER_SITE_OTHER): Payer: Medicare Other

## 2013-12-21 ENCOUNTER — Encounter (INDEPENDENT_AMBULATORY_CARE_PROVIDER_SITE_OTHER): Payer: Self-pay

## 2013-12-21 ENCOUNTER — Ambulatory Visit (INDEPENDENT_AMBULATORY_CARE_PROVIDER_SITE_OTHER): Payer: Medicare Other | Admitting: Family Medicine

## 2013-12-21 VITALS — BP 114/64 | HR 89 | Temp 97.4°F | Ht 69.5 in | Wt 150.6 lb

## 2013-12-21 DIAGNOSIS — E785 Hyperlipidemia, unspecified: Secondary | ICD-10-CM

## 2013-12-21 DIAGNOSIS — R059 Cough, unspecified: Secondary | ICD-10-CM

## 2013-12-21 DIAGNOSIS — R0989 Other specified symptoms and signs involving the circulatory and respiratory systems: Secondary | ICD-10-CM

## 2013-12-21 DIAGNOSIS — R7309 Other abnormal glucose: Secondary | ICD-10-CM

## 2013-12-21 DIAGNOSIS — J209 Acute bronchitis, unspecified: Secondary | ICD-10-CM

## 2013-12-21 DIAGNOSIS — R05 Cough: Secondary | ICD-10-CM

## 2013-12-21 DIAGNOSIS — N400 Enlarged prostate without lower urinary tract symptoms: Secondary | ICD-10-CM

## 2013-12-21 DIAGNOSIS — R7303 Prediabetes: Secondary | ICD-10-CM

## 2013-12-21 DIAGNOSIS — I10 Essential (primary) hypertension: Secondary | ICD-10-CM

## 2013-12-21 DIAGNOSIS — K219 Gastro-esophageal reflux disease without esophagitis: Secondary | ICD-10-CM

## 2013-12-21 DIAGNOSIS — R011 Cardiac murmur, unspecified: Secondary | ICD-10-CM

## 2013-12-21 MED ORDER — CEPHALEXIN 500 MG PO CAPS: 500.0000 mg | ORAL_CAPSULE | Freq: Three times a day (TID) | ORAL | Status: DC

## 2013-12-21 NOTE — Progress Notes (Signed)
Patient ID: Jose Hardin, male   DOB: 1930-05-12, 79 y.o.   MRN: [id number] SUBJECTIVE: CC: Chief Complaint  Patient presents with  . Acute Visit    cough congestion     HPI: Upper Respiratory Infection Patient complains of symptoms of a URI,  Symptoms include congestion and nasal congestion. Onset of symptoms was 3 days ago, and has been gradually worsening since that time. Treatment to date: none Coughing up thick phlegm. No chest pains , no palpitations..    Past Medical History  Diagnosis Date  . Carotid bruit present   . Hypertension   . BPH (benign prostatic hyperplasia)   . Colovesical fistula   . Vertebral compression fracture   . Prediabetes   . Hyperlipidemia   . Osteoporosis   . Heart murmur   . GERD (gastroesophageal reflux disease)    Past Surgical History  Procedure Laterality Date  . Transurethral resection of prostate    . Colon surgery      sigmoid coloectomy  . Eye surgery      cataracts   History   Social History  . Marital Status: Single    Spouse Name: N/A    Number of Children: N/A  . Years of Education: N/A   Occupational History  . Not on file.   Social History Main Topics  . Smoking status: Former Smoker    Quit date: 12/30/1981  . Smokeless tobacco: Not on file  . Alcohol Use: Not on file  . Drug Use: Not on file  . Sexual Activity: Not on file   Other Topics Concern  . Not on file   Social History Narrative  . No narrative on file   No family history on file. Current Outpatient Prescriptions on File Prior to Visit  Medication Sig Dispense Refill  . alendronate (FOSAMAX) 70 MG tablet Take 1 tablet (70 mg total) by mouth every 7 (seven) days.  4 tablet  5  . amLODipine (NORVASC) 5 MG tablet TAKE 1 TABLET ONCE DAILY.  30 tablet  3  . aspirin 325 MG tablet Take 325 mg by mouth daily.      Marland Kitchen atorvastatin (LIPITOR) 40 MG tablet TAKE 1 TABLET ONCE DAILY.  30 tablet  5  . benazepril (LOTENSIN) 10 MG tablet Take 1 tablet (10 mg  total) by mouth daily.  30 tablet  5  . cholecalciferol (VITAMIN D) 1000 UNITS tablet Take 1,000 Units by mouth daily.      . clotrimazole-betamethasone (LOTRISONE) cream Apply topically 2 (two) times daily.  30 g  1  . fluticasone (FLONASE) 50 MCG/ACT nasal spray Place 1 spray into the nose daily.  16 g  4  . lidocaine (LIDODERM) 5 % Apply 1 patch to area of pain.  After 12 hours Remove & Discard patch.  Wait 12 hours and then may apply another patch.  30 patch  2  . meclizine (ANTIVERT) 25 MG tablet Take one tablet three times daily for 10 days  30 tablet  0  . methotrexate (RHEUMATREX) 2.5 MG tablet Take 2.5 mg by mouth. 2 tablets weekly      . Multiple Vitamin (MULTI-VITAMIN DAILY PO) Take 1 tablet by mouth daily.      Marland Kitchen omeprazole (PRILOSEC) 20 MG capsule Take 1 capsule (20 mg total) by mouth daily.  30 capsule  4  . traMADol (ULTRAM) 50 MG tablet Take one tablet twice a day  60 tablet  0  . zolpidem (AMBIEN) 10 MG tablet Take  10 mg by mouth at bedtime as needed.        No current facility-administered medications on file prior to visit.   No Known Allergies Immunization History  Administered Date(s) Administered  . Influenza,inj,Quad PF,36+ Mos 09/29/2013  . Pneumococcal Polysaccharide-23 12/30/2009  . Tdap 09/30/2011   Prior to Admission medications   Medication Sig Start Date End Date Taking? Authorizing Provider  alendronate (FOSAMAX) 70 MG tablet Take 1 tablet (70 mg total) by mouth every 7 (seven) days. 12/06/13   Ileana Ladd, MD  amLODipine (NORVASC) 5 MG tablet TAKE 1 TABLET ONCE DAILY. 10/15/13   Ileana Ladd, MD  aspirin 325 MG tablet Take 325 mg by mouth daily.    Historical Provider, MD  atorvastatin (LIPITOR) 40 MG tablet TAKE 1 TABLET ONCE DAILY. 12/09/13   Ernestina Penna, MD  benazepril (LOTENSIN) 10 MG tablet Take 1 tablet (10 mg total) by mouth daily. 12/20/13   Ileana Ladd, MD  cholecalciferol (VITAMIN D) 1000 UNITS tablet Take 1,000 Units by mouth daily.     Historical Provider, MD  clotrimazole-betamethasone (LOTRISONE) cream Apply topically 2 (two) times daily. 11/01/13   Coralie Keens, FNP  fluticasone (FLONASE) 50 MCG/ACT nasal spray Place 1 spray into the nose daily. 05/20/13   Ileana Ladd, MD  lidocaine (LIDODERM) 5 % Apply 1 patch to area of pain.  After 12 hours Remove & Discard patch.  Wait 12 hours and then may apply another patch. 09/29/13   Ileana Ladd, MD  meclizine (ANTIVERT) 25 MG tablet Take one tablet three times daily for 10 days 09/03/13   Ileana Ladd, MD  methotrexate (RHEUMATREX) 2.5 MG tablet Take 2.5 mg by mouth. 2 tablets weekly 04/15/13   Historical Provider, MD  Multiple Vitamin (MULTI-VITAMIN DAILY PO) Take 1 tablet by mouth daily.    Historical Provider, MD  omeprazole (PRILOSEC) 20 MG capsule Take 1 capsule (20 mg total) by mouth daily. 06/12/13   Ileana Ladd, MD  traMADol Janean Sark) 50 MG tablet Take one tablet twice a day 11/30/13   Ileana Ladd, MD  zolpidem (AMBIEN) 10 MG tablet Take 10 mg by mouth at bedtime as needed.  01/28/13   Historical Provider, MD     ROS: As above in the HPI. All other systems are stable or negative.  OBJECTIVE: APPEARANCE:  Patient in no acute distress.The patient appeared well nourished and normally developed. Acyanotic. Waist: VITAL SIGNS:BP 114/64  Pulse 89  Temp(Src) 97.4 F (36.3 C) (Oral)  Ht 5' 9.5" (1.765 m)  Wt 150 lb 9.6 oz (68.312 kg)  BMI 21.93 kg/m2  SpO2 99%  WM congested SKIN: warm and  Dry without overt rashes, tattoos and scars  HEAD and Neck: without JVD, Head and scalp: normal Eyes:No scleral icterus. Fundi normal, eye movements normal. Ears: Auricle normal, canal normal, Tympanic membranes normal, insufflation normal. Nose: normal Throat: normal Neck & thyroid: normal  CHEST & LUNGS: Chest wall: normal Lungs: Rhonchi in the bases left worse than the right.  CVS: Reveals the PMI to be normally located. Regular rhythm, First and Second Heart  sounds are normal,  absence of murmurs, rubs or gallops. Peripheral vasculature: Radial pulses: normal Dorsal pedis pulses: normal Posterior pulses: normal  ABDOMEN:  Appearance: normal Benign, no organomegaly, no masses, no Abdominal Aortic enlargement. No Guarding , no rebound. No Bruits. Bowel sounds: normal  RECTAL: N/A GU: N/A  EXTREMETIES: nonedematous.  MUSCULOSKELETAL:  Spine: normal Joints: intact  NEUROLOGIC:  oriented to time,place and person; nonfocal. Strength is normal Sensory is normal Reflexes are normal Cranial Nerves are normal.  ASSESSMENT: Cough - Plan: DG Chest 2 View  HLD (hyperlipidemia)  HTN (hypertension)  Prediabetes  Right carotid bruit  GERD (gastroesophageal reflux disease)  Heart murmur  BPH (benign prostatic hyperplasia)  Acute bronchitis - Plan: cephALEXin (KEFLEX) 500 MG capsule  PLAN:  Orders Placed This Encounter  Procedures  . DG Chest 2 View    Standing Status: Future     Number of Occurrences: 1     Standing Expiration Date: 02/21/2015    Order Specific Question:  Reason for Exam (SYMPTOM  OR DIAGNOSIS REQUIRED)    Answer:  cough    Order Specific Question:  Preferred imaging location?    Answer:  Internal  [facility] reading (PRIMARY) by  Dr. Modesto Charon: increased markings LLL and atelectasis. Bronchitis                             Meds ordered this encounter  Medications  . cephALEXin (KEFLEX) 500 MG capsule    Sig: Take 1 capsule (500 mg total) by mouth 3 (three) times daily.    Dispense:  30 capsule    Refill:  0   There are no discontinued medications. Return if symptoms worsen or fail to improve.  Breyden Jeudy P. [place] Charon, M.D.

## 2014-01-06 ENCOUNTER — Other Ambulatory Visit: Payer: Self-pay | Admitting: Family Medicine

## 2014-01-17 ENCOUNTER — Other Ambulatory Visit: Payer: Self-pay | Admitting: *Deleted

## 2014-01-17 DIAGNOSIS — M549 Dorsalgia, unspecified: Secondary | ICD-10-CM

## 2014-01-17 MED ORDER — TRAMADOL HCL 50 MG PO TABS: ORAL_TABLET | ORAL | Status: DC

## 2014-01-17 NOTE — Telephone Encounter (Signed)
Rx ready to pick up patient aware

## 2014-01-17 NOTE — Telephone Encounter (Signed)
Patient last seen in office on 12-21-13. Rx last filled on 11-30-13 for #[id number]. Please advise. If approved please print and route to Pool A so nurse can call patient to pick up

## 2014-01-17 NOTE — Telephone Encounter (Signed)
Rx ready for pick up. 

## 2014-01-20 ENCOUNTER — Other Ambulatory Visit: Payer: Self-pay | Admitting: Family Medicine

## 2014-01-24 NOTE — Telephone Encounter (Signed)
Call patient : Prescription refilled & sent to pharmacy in [place]. 

## 2014-03-11 ENCOUNTER — Ambulatory Visit (INDEPENDENT_AMBULATORY_CARE_PROVIDER_SITE_OTHER): Payer: Medicare Other | Admitting: Family Medicine

## 2014-03-11 ENCOUNTER — Encounter: Payer: Self-pay | Admitting: Family Medicine

## 2014-03-11 VITALS — BP 142/74 | HR 67 | Temp 97.3°F | Ht 69.0 in | Wt 154.2 lb

## 2014-03-11 DIAGNOSIS — R011 Cardiac murmur, unspecified: Secondary | ICD-10-CM

## 2014-03-11 DIAGNOSIS — R7309 Other abnormal glucose: Secondary | ICD-10-CM

## 2014-03-11 DIAGNOSIS — R0989 Other specified symptoms and signs involving the circulatory and respiratory systems: Secondary | ICD-10-CM

## 2014-03-11 DIAGNOSIS — M549 Dorsalgia, unspecified: Secondary | ICD-10-CM

## 2014-03-11 DIAGNOSIS — I10 Essential (primary) hypertension: Secondary | ICD-10-CM

## 2014-03-11 DIAGNOSIS — R7303 Prediabetes: Secondary | ICD-10-CM

## 2014-03-11 DIAGNOSIS — E785 Hyperlipidemia, unspecified: Secondary | ICD-10-CM

## 2014-03-11 DIAGNOSIS — N400 Enlarged prostate without lower urinary tract symptoms: Secondary | ICD-10-CM

## 2014-03-11 DIAGNOSIS — M810 Age-related osteoporosis without current pathological fracture: Secondary | ICD-10-CM

## 2014-03-11 DIAGNOSIS — M4850XA Collapsed vertebra, not elsewhere classified, site unspecified, initial encounter for fracture: Secondary | ICD-10-CM

## 2014-03-11 DIAGNOSIS — R202 Paresthesia of skin: Secondary | ICD-10-CM

## 2014-03-11 DIAGNOSIS — R209 Unspecified disturbances of skin sensation: Secondary | ICD-10-CM

## 2014-03-11 DIAGNOSIS — K219 Gastro-esophageal reflux disease without esophagitis: Secondary | ICD-10-CM

## 2014-03-11 DIAGNOSIS — G4700 Insomnia, unspecified: Secondary | ICD-10-CM

## 2014-03-11 LAB — POCT GLYCOSYLATED HEMOGLOBIN (HGB A1C): Hemoglobin A1C: 6

## 2014-03-11 NOTE — Patient Instructions (Signed)
Diabetes and Exercise Exercising regularly is important. It is not just about losing weight. It has many health benefits, such as:  Improving your overall fitness, flexibility, and endurance.  Increasing your bone density.  Helping with weight control.  Decreasing your body fat.  Increasing your muscle strength.  Reducing stress and tension.  Improving your overall health. People with diabetes who exercise gain additional benefits because exercise:  Reduces appetite.  Improves the body's use of blood sugar (glucose).  Helps lower or control blood glucose.  Decreases blood pressure.  Helps control blood lipids (such as cholesterol and triglycerides).  Improves the body's use of the hormone insulin by:  Increasing the body's insulin sensitivity.  Reducing the body's insulin needs.  Decreases the risk for heart disease because exercising:  Lowers cholesterol and triglycerides levels.  Increases the levels of good cholesterol (such as high-density lipoproteins [HDL]) in the body.  Lowers blood glucose levels. YOUR ACTIVITY PLAN  Choose an activity that you enjoy and set realistic goals. Your health care provider or diabetes educator can help you make an activity plan that works for you. You can break activities into 2 or 3 sessions throughout the day. Doing so is as good as one long session. Exercise ideas include:  Taking the dog for a walk.  Taking the stairs instead of the elevator.  Dancing to your favorite song.  Doing your favorite exercise with a friend. RECOMMENDATIONS FOR EXERCISING WITH TYPE 1 OR TYPE 2 DIABETES   Check your blood glucose before exercising. If blood glucose levels are greater than 240 mg/dL, check for urine ketones. Do not exercise if ketones are present.  Avoid injecting insulin into areas of the body that are going to be exercised. For example, avoid injecting insulin into:  The arms when playing tennis.  The legs when  jogging.  Keep a record of:  Food intake before and after you exercise.  Expected peak times of insulin action.  Blood glucose levels before and after you exercise.  The type and amount of exercise you have done.  Review your records with your health care provider. Your health care provider will help you to develop guidelines for adjusting food intake and insulin amounts before and after exercising.  If you take insulin or oral hypoglycemic agents, watch for signs and symptoms of hypoglycemia. They include:  Dizziness. Shaking.Diabetes and Foot Care Diabetes may cause you to have problems because of poor blood supply (circulation) to your feet and legs. This may cause the skin on your feet to become thinner, break easier, and heal more slowly. Your skin may become dry, and the skin may peel and crack. You may also have nerve damage in your legs and feet causing decreased feeling in them. You may not notice minor injuries to your feet that could lead to infections or more serious problems. Taking care of your feet is one of the most important things you can do for yourself.  HOME CARE INSTRUCTIONS Wear shoes at all times, even in the house. Do not go barefoot. Bare feet are easily injured. Check your feet daily for blisters, cuts, and redness. If you cannot see the bottom of your feet, use a mirror or ask someone for help. Wash your feet with warm water (do not use hot water) and mild soap. Then pat your feet and the areas between your toes until they are completely dry. Do not soak your feet as this can dry your skin. Apply a moisturizing lotion or petroleum  jelly (that does not contain alcohol and is unscented) to the skin on your feet and to dry, brittle toenails. Do not apply lotion between your toes. Trim your toenails straight across. Do not dig under them or around the cuticle. File the edges of your nails with an emery board or nail file. Do not cut corns or calluses or try to remove  them with medicine. Wear clean socks or stockings every day. Make sure they are not too tight. Do not wear knee-high stockings since they may decrease blood flow to your legs. Wear shoes that fit properly and have enough cushioning. To break in new shoes, wear them for just a few hours a day. This prevents you from injuring your feet. Always look in your shoes before you put them on to be sure there are no objects inside. Do not cross your legs. This may decrease the blood flow to your feet. If you find a minor scrape, cut, or break in the skin on your feet, keep it and the skin around it clean and dry. These areas may be cleansed with mild soap and water. Do not cleanse the area with peroxide, alcohol, or iodine. When you remove an adhesive bandage, be sure not to damage the skin around it. If you have a wound, look at it several times a day to make sure it is healing. Do not use heating pads or hot water bottles. They may burn your skin. If you have lost feeling in your feet or legs, you may not know it is happening until it is too late. Make sure your health care provider performs a complete foot exam at least annually or more often if you have foot problems. Report any cuts, sores, or bruises to your health care provider immediately. SEEK MEDICAL CARE IF:  You have an injury that is not healing. You have cuts or breaks in the skin. You have an ingrown nail. You notice redness on your legs or feet. You feel burning or tingling in your legs or feet. You have pain or cramps in your legs and feet. Your legs or feet are numb. Your feet always feel cold. SEEK IMMEDIATE MEDICAL CARE IF:  There is increasing redness, swelling, or pain in or around a wound. There is a red line that goes up your leg. Pus is coming from a wound. You develop a fever or as directed by your health care provider. You notice a bad smell coming from an ulcer or wound. Document Released: 12/13/2000 Document Revised:  08/18/2013 Document Reviewed: 05/25/2013 [facility] Patient Information 2014 [place].  Sweating.  Chills.  Confusion.  Drink plenty of water while you exercise to prevent dehydration or heat stroke. Body water is lost during exercise and must be replaced.  Talk to your health care provider before starting an exercise program to make sure it is safe for you. Remember, almost any type of activity is better than none. Document Released: 03/07/2004 Document Revised: 08/18/2013 Document Reviewed: 05/25/2013 [facility] Patient Information 2014 [place].  Please follow up at the [facility] for the bruit in the the right neck reflecting a carotid artery blockage. This needs follow up. Also, your heart murmur needs a recheck.  Will check for Diabetes today.

## 2014-03-11 NOTE — Progress Notes (Signed)
Quick Note:  Call Patient Labs that are abnormal: HGBA1C is reflective of pre-DM Recommendations: Diet and exercise. Plant based Diet.to control sugar.   ______

## 2014-03-11 NOTE — Progress Notes (Signed)
Patient ID: Jose Hardin, male   DOB: [birth year], 80 y.o.   MRN: [id number] SUBJECTIVE: CC: Chief Complaint  Patient presents with  . Follow-up    3 month ck up no problems went to [facility] office Feb 6 and had labs     HPI: Patient is here for follow up of hyperlipidemia/preDM/HTN/osteoporosis/: denies Headache;denies Chest Pain;denies weakness;denies Shortness of Breath and orthopnea;denies Visual changes;denies palpitations;denies cough;denies pedal edema;denies symptoms of TIA or stroke;deniesClaudication symptoms. admits to Compliance with medications; denies Problems with medications.  Seen in Feb/2015 at the [facility] clinic and has been well. Labs  Drawn and only the sugar is a little elevated. He brings a copy in.  Past Medical History  Diagnosis Date  . Carotid bruit present   . Hypertension   . BPH (benign prostatic hyperplasia)   . Colovesical fistula   . Vertebral compression fracture   . Prediabetes   . Hyperlipidemia   . Osteoporosis   . Heart murmur   . GERD (gastroesophageal reflux disease)    Past Surgical History  Procedure Laterality Date  . Transurethral resection of prostate    . Colon surgery      sigmoid coloectomy  . Eye surgery      cataracts   History   Social History  . Marital Status: Single    Spouse Name: N/A    Number of Children: N/A  . Years of Education: N/A   Occupational History  . Not on file.   Social History Main Topics  . Smoking status: Former Smoker    Quit date: 12/30/1981  . Smokeless tobacco: Not on file  . Alcohol Use: Not on file  . Drug Use: Not on file  . Sexual Activity: Not on file   Other Topics Concern  . Not on file   Social History Narrative  . No narrative on file   No family history on file. Current Outpatient Prescriptions on File Prior to Visit  Medication Sig Dispense Refill  . alendronate (FOSAMAX) 70 MG tablet Take 1 tablet (70 mg total) by mouth every 7 (seven) days.  4 tablet  5  . amLODipine  (NORVASC) 5 MG tablet TAKE 1 TABLET ONCE DAILY.  30 tablet  3  . aspirin 325 MG tablet Take 325 mg by mouth daily.      Marland Kitchen atorvastatin (LIPITOR) 40 MG tablet TAKE 1 TABLET ONCE DAILY.  30 tablet  5  . benazepril (LOTENSIN) 10 MG tablet Take 1 tablet (10 mg total) by mouth daily.  30 tablet  5  . cholecalciferol (VITAMIN D) 1000 UNITS tablet Take 1,000 Units by mouth daily.      . clotrimazole-betamethasone (LOTRISONE) cream Apply topically 2 (two) times daily.  30 g  1  . fluticasone (FLONASE) 50 MCG/ACT nasal spray Place 1 spray into the nose daily.  16 g  4  . meclizine (ANTIVERT) 25 MG tablet TAKE 1 TABLET THREE TIMES DAILY FOR 10 DAYS.  30 tablet  0  . methotrexate (RHEUMATREX) 2.5 MG tablet Take 2.5 mg by mouth. 2 tablets weekly      . Multiple Vitamin (MULTI-VITAMIN DAILY PO) Take 1 tablet by mouth daily.      Marland Kitchen omeprazole (PRILOSEC) 20 MG capsule TAKE (1) CAPSULE DAILY.  30 capsule  5  . traMADol (ULTRAM) 50 MG tablet Take one tablet twice a day  60 tablet  0  . zolpidem (AMBIEN) 10 MG tablet Take 10 mg by mouth at bedtime as needed.       Marland Kitchen  cephALEXin (KEFLEX) 500 MG capsule Take 1 capsule (500 mg total) by mouth 3 (three) times daily.  30 capsule  0  . lidocaine (LIDODERM) 5 % Apply 1 patch to area of pain.  After 12 hours Remove & Discard patch.  Wait 12 hours and then may apply another patch.  30 patch  2   No current facility-administered medications on file prior to visit.   No Known Allergies Immunization History  Administered Date(s) Administered  . Influenza,inj,Quad PF,36+ Mos 09/29/2013  . Pneumococcal Conjugate-13 01/30/2014  . Pneumococcal Polysaccharide-23 12/30/2009  . Tdap 09/30/2011   Prior to Admission medications   Medication Sig Start Date End Date Taking? Authorizing Provider  alendronate (FOSAMAX) 70 MG tablet Take 1 tablet (70 mg total) by mouth every 7 (seven) days. 12/06/13  Yes Vernie Shanks, MD  amLODipine (NORVASC) 5 MG tablet TAKE 1 TABLET ONCE DAILY.  10/15/13  Yes Vernie Shanks, MD  aspirin 325 MG tablet Take 325 mg by mouth daily.   Yes Historical Provider, MD  atorvastatin (LIPITOR) 40 MG tablet TAKE 1 TABLET ONCE DAILY. 12/09/13  Yes Chipper Herb, MD  benazepril (LOTENSIN) 10 MG tablet Take 1 tablet (10 mg total) by mouth daily. 12/20/13  Yes Vernie Shanks, MD  cholecalciferol (VITAMIN D) 1000 UNITS tablet Take 1,000 Units by mouth daily.   Yes Historical Provider, MD  clotrimazole-betamethasone (LOTRISONE) cream Apply topically 2 (two) times daily. 11/01/13  Yes Mae Loree Fee, FNP  fluticasone (FLONASE) 50 MCG/ACT nasal spray Place 1 spray into the nose daily. 05/20/13  Yes Vernie Shanks, MD  meclizine (ANTIVERT) 25 MG tablet TAKE 1 TABLET THREE TIMES DAILY FOR 10 DAYS. 01/20/14  Yes Vernie Shanks, MD  methotrexate (RHEUMATREX) 2.5 MG tablet Take 2.5 mg by mouth. 2 tablets weekly 04/15/13  Yes Historical Provider, MD  Multiple Vitamin (MULTI-VITAMIN DAILY PO) Take 1 tablet by mouth daily.   Yes Historical Provider, MD  omeprazole (PRILOSEC) 20 MG capsule TAKE (1) CAPSULE DAILY. 01/06/14  Yes Vernie Shanks, MD  traMADol Veatrice Bourbon) 50 MG tablet Take one tablet twice a day 01/17/14  Yes Vernie Shanks, MD  zolpidem (AMBIEN) 10 MG tablet Take 10 mg by mouth at bedtime as needed.  01/28/13  Yes Historical Provider, MD  cephALEXin (KEFLEX) 500 MG capsule Take 1 capsule (500 mg total) by mouth 3 (three) times daily. 12/21/13   Vernie Shanks, MD  lidocaine (LIDODERM) 5 % Apply 1 patch to area of pain.  After 12 hours Remove & Discard patch.  Wait 12 hours and then may apply another patch. 09/29/13   Vernie Shanks, MD     ROS: As above in the HPI. All other systems are stable or negative.  OBJECTIVE: APPEARANCE:  Patient in no acute distress.The patient appeared well nourished and normally developed. Acyanotic. Waist: VITAL SIGNS:BP 142/74  Pulse 67  Temp(Src) 97.3 F (36.3 C) (Oral)  Ht 5\' 9"  (1.753 m)  Wt 154 lb 3.2 oz (69.945 kg)   BMI 22.76 kg/m2 WM elderly. Looks well  SKIN: warm and  Dry without overt rashes, tattoos and scars  HEAD and Neck: without JVD, Head and scalp: normal Eyes:No scleral icterus. Fundi normal, eye movements normal. Ears: Auricle normal, canal normal, Tympanic membranes normal, insufflation normal. Nose: normal Throat: normal Neck : right carotid bruit unchanged thyroid: normal  CHEST & LUNGS: Chest wall: normal Lungs: Clear  CVS: Reveals the PMI to be normally located. Regular rhythm, First and  Second Heart sounds are normal,  2/6 SEM at the LSB no change  no rubs or gallops. Peripheral vasculature: Radial pulses: normal Dorsal pedis pulses: normal Posterior pulses: normal  ABDOMEN:  Appearance: normal Benign, no organomegaly, no masses, no Abdominal Aortic enlargement. No Guarding , no rebound. No Bruits. Bowel sounds: normal  RECTAL: N/A GU: N/A  EXTREMETIES: nonedematous.  MUSCULOSKELETAL:  Spine: kyphosis. With reduced ROM Joints: intact  NEUROLOGIC: oriented to time,place and person; nonfocal. Strength is normal Sensory is normal Reflexes are normal Cranial Nerves are normal.  ASSESSMENT: Right carotid bruit  Prediabetes - Plan: POCT glycosylated hemoglobin (Hb A1C)  Osteoporosis  HTN (hypertension)  HLD (hyperlipidemia)  Carotid bruit present  Vertebral compression fracture  Insomnia  Heart murmur  GERD (gastroesophageal reflux disease)  Paresthesia of bilateral legs  Back pain  BPH (benign prostatic hyperplasia)  PLAN:  He is scheduled at the [facility] for a MRI of his  Spine. Labs reviewed and  Scanned into EPIC.looks great except for hyperglycemia. Continue present medications. Diet and exercise.  See handouts in AVS discussed with patient.  Wellness reviewwed.  Recommended that he should have his carotid doppler for evaluation of his Carotid bruit at the [place] and also his heart murmur followed up there because of his benefits, he  prefers to have the [place] evaluate.  Orders Placed This Encounter  Procedures  . POCT glycosylated hemoglobin (Hb A1C)   No orders of the defined types were placed in this encounter.   There are no discontinued medications. Return in about 4 months (around 07/11/2014) for Recheck medical problems.  Birdie Fetty P. Jacelyn Grip, M.D.

## 2014-03-24 ENCOUNTER — Other Ambulatory Visit: Payer: Self-pay | Admitting: *Deleted

## 2014-03-24 MED ORDER — AMLODIPINE BESYLATE 5 MG PO TABS: ORAL_TABLET | ORAL | Status: DC

## 2014-04-15 ENCOUNTER — Ambulatory Visit (INDEPENDENT_AMBULATORY_CARE_PROVIDER_SITE_OTHER): Payer: Medicare Other | Admitting: Family Medicine

## 2014-04-15 ENCOUNTER — Encounter: Payer: Self-pay | Admitting: Family Medicine

## 2014-04-15 VITALS — BP 135/75 | HR 74 | Temp 98.6°F | Ht 69.0 in | Wt 152.8 lb

## 2014-04-15 DIAGNOSIS — J209 Acute bronchitis, unspecified: Secondary | ICD-10-CM

## 2014-04-15 MED ORDER — AZITHROMYCIN 250 MG PO TABS: ORAL_TABLET | ORAL | Status: DC

## 2014-04-15 MED ORDER — METHYLPREDNISOLONE ACETATE 80 MG/ML IJ SUSP
80.0000 mg | Freq: Once | INTRAMUSCULAR | Status: AC
  Administered 2014-04-15: 80 mg via INTRAMUSCULAR

## 2014-04-15 NOTE — Progress Notes (Signed)
   Subjective:    Patient ID: Jose Hardin, male    DOB: 05/11/1930, 80 y.o.   MRN: [id number]  HPI This 80 y.o. male presents for evaluation of cough and congestion for over a week.   Review of Systems No chest pain, SOB, HA, dizziness, vision change, N/V, diarrhea, constipation, dysuria, urinary urgency or frequency, myalgias, arthralgias or rash.     Objective:   Physical Exam Vital signs noted  Well developed well nourished male.  HEENT - Head atraumatic Normocephalic                Eyes - PERRLA, Conjuctiva - clear Sclera- Clear EOMI                Ears - EAC's Wnl TM's Wnl Gross Hearing WNL                Throat - oropharanx wnl Respiratory - Lungs CTA bilateral Cardiac - RRR S1 and S2 without murmur GI - Abdomen soft Nontender and bowel sounds active x 4 Extremities - No edema. Neuro - Grossly intact.       Assessment & Plan:  Acute bronchitis - Plan: methylPREDNISolone acetate (DEPO-MEDROL) injection 80 mg zpak as directed.  Push po fluids, rest, tylenol and motrin otc prn as directed for fever, arthralgias, and myalgias.  Follow up prn if sx's continue or persist.  Lysbeth Penner FNP

## 2014-05-09 ENCOUNTER — Telehealth: Payer: Self-pay | Admitting: Family Medicine

## 2014-05-09 NOTE — Telephone Encounter (Signed)
appt given for Friday at 2 with bill oxford

## 2014-05-13 ENCOUNTER — Ambulatory Visit (INDEPENDENT_AMBULATORY_CARE_PROVIDER_SITE_OTHER): Payer: Medicare Other | Admitting: Family Medicine

## 2014-05-13 ENCOUNTER — Encounter: Payer: Self-pay | Admitting: Family Medicine

## 2014-05-13 VITALS — BP 134/65 | HR 80 | Temp 98.0°F | Ht 69.0 in | Wt 152.0 lb

## 2014-05-13 DIAGNOSIS — J329 Chronic sinusitis, unspecified: Secondary | ICD-10-CM

## 2014-05-13 NOTE — Progress Notes (Signed)
   Subjective:    Patient ID: TYJUAN DEMETRO, male    DOB: 10/13/1930, 80 y.o.   MRN: [id number]  HPI  This 80 y.o. male presents for evaluation of sinus infection.  He was seen by the ED on 05/08/14 and rx'd augmentin and is feeling better.  Review of Systems    No chest pain, SOB, HA, dizziness, vision change, N/V, diarrhea, constipation, dysuria, urinary urgency or frequency, myalgias, arthralgias or rash.  Objective:   Physical Exam  Vital signs noted  Well developed well nourished male.  HEENT - Head atraumatic Normocephalic                Eyes - PERRLA, Conjuctiva - clear Sclera- Clear EOMI                Ears - EAC's Wnl TM's Wnl Gross Hearing WNL                Nose - Nares patent                 Throat - oropharanx wnl Respiratory - Lungs CTA bilateral Cardiac - RRR S1 and S2 without murmur GI - Abdomen soft Nontender and bowel sounds active x 4 Extremities - No edema. Neuro - Grossly intact.      Assessment & Plan:  Sinusitis Continue augmentin Push po fluids, rest, tylenol and motrin otc prn as directed for fever, arthralgias, and myalgias.  Follow up prn if sx's continue or persist. Lysbeth Penner FNP

## 2014-06-19 ENCOUNTER — Other Ambulatory Visit: Payer: Self-pay | Admitting: *Deleted

## 2014-06-19 MED ORDER — ALENDRONATE SODIUM 70 MG PO TABS: 70.0000 mg | ORAL_TABLET | ORAL | Status: DC

## 2014-07-29 ENCOUNTER — Other Ambulatory Visit: Payer: Self-pay | Admitting: *Deleted

## 2014-07-29 MED ORDER — ATORVASTATIN CALCIUM 40 MG PO TABS: ORAL_TABLET | ORAL | Status: DC

## 2014-09-04 ENCOUNTER — Other Ambulatory Visit: Payer: Self-pay | Admitting: *Deleted

## 2014-09-04 MED ORDER — OMEPRAZOLE 20 MG PO CPDR: DELAYED_RELEASE_CAPSULE | ORAL | Status: DC

## 2014-09-12 ENCOUNTER — Other Ambulatory Visit: Payer: Self-pay | Admitting: *Deleted

## 2014-09-12 MED ORDER — AMLODIPINE BESYLATE 5 MG PO TABS: ORAL_TABLET | ORAL | Status: DC

## 2014-09-15 ENCOUNTER — Ambulatory Visit: Payer: Medicare Other | Admitting: Family Medicine

## 2014-09-22 ENCOUNTER — Other Ambulatory Visit: Payer: Self-pay | Admitting: Family Medicine

## 2014-09-23 ENCOUNTER — Ambulatory Visit (INDEPENDENT_AMBULATORY_CARE_PROVIDER_SITE_OTHER): Payer: Medicare Other | Admitting: Family Medicine

## 2014-09-23 ENCOUNTER — Encounter: Payer: Self-pay | Admitting: Family Medicine

## 2014-09-23 VITALS — BP 157/76 | HR 73 | Temp 97.7°F | Ht 69.0 in | Wt 161.0 lb

## 2014-09-23 DIAGNOSIS — I10 Essential (primary) hypertension: Secondary | ICD-10-CM

## 2014-09-23 DIAGNOSIS — E785 Hyperlipidemia, unspecified: Secondary | ICD-10-CM

## 2014-09-23 MED ORDER — GABAPENTIN 300 MG PO CAPS: 300.0000 mg | ORAL_CAPSULE | Freq: Every day | ORAL | Status: DC

## 2014-09-23 MED ORDER — FLUTICASONE PROPIONATE 50 MCG/ACT NA SUSP: 1.0000 | Freq: Every day | NASAL | Status: DC

## 2014-09-23 NOTE — Progress Notes (Signed)
   Subjective:    Patient ID: Jose Hardin, male    DOB: 08-Dec-1930, 80 y.o.   MRN: [id number]  HPI      Patient Active Problem List   Diagnosis Date Noted  . Carotid bruit present   . Hypertension   . BPH (benign prostatic hyperplasia)   . Colovesical fistula   . Vertebral compression fracture   . Hyperlipidemia   . Osteoporosis   . Heart murmur   . GERD (gastroesophageal reflux disease)   . Right carotid bruit 05/07/2013  . HTN (hypertension) 05/07/2013  . HLD (hyperlipidemia) 05/07/2013  . Insomnia 05/07/2013  . Prediabetes 05/07/2013  . Back pain 05/07/2013  . Paresthesia of bilateral legs 05/07/2013   Outpatient Encounter Prescriptions as of 09/23/2014  Medication Sig  . alendronate (FOSAMAX) 70 MG tablet Take 1 tablet (70 mg total) by mouth every 7 (seven) days.  Marland Kitchen amLODipine (NORVASC) 5 MG tablet TAKE 1 TABLET ONCE DAILY.  Marland Kitchen aspirin 325 MG tablet Take 325 mg by mouth daily.  Marland Kitchen atorvastatin (LIPITOR) 40 MG tablet TAKE 1 TABLET ONCE DAILY.  . benazepril (LOTENSIN) 10 MG tablet Take 1 tablet (10 mg total) by mouth daily.  . cholecalciferol (VITAMIN D) 1000 UNITS tablet Take 1,000 Units by mouth daily.  . fluorometholone (FML) 0.1 % ophthalmic suspension   . fluticasone (FLONASE) 50 MCG/ACT nasal spray Place 1 spray into the nose daily.  . meclizine (ANTIVERT) 25 MG tablet TAKE 1 TABLET THREE TIMES DAILY FOR 10 DAYS.  Marland Kitchen methotrexate (RHEUMATREX) 2.5 MG tablet Take 2.5 mg by mouth. 2 tablets weekly  . Multiple Vitamin (MULTI-VITAMIN DAILY PO) Take 1 tablet by mouth daily.  Marland Kitchen omeprazole (PRILOSEC) 20 MG capsule TAKE (1) CAPSULE DAILY.  . traMADol (ULTRAM) 50 MG tablet Take one tablet twice a day  . zolpidem (AMBIEN) 10 MG tablet Take 10 mg by mouth at bedtime as needed.   . [DISCONTINUED] amoxicillin-clavulanate (AUGMENTIN) 875-125 MG per tablet   . [DISCONTINUED] clotrimazole-betamethasone (LOTRISONE) cream Apply topically 2 (two) times daily.  . [DISCONTINUED]  lidocaine (LIDODERM) 5 % Apply 1 patch to area of pain.  After 12 hours Remove & Discard patch.  Wait 12 hours and then may apply another patch.  . [DISCONTINUED] montelukast (SINGULAIR) 10 MG tablet   . [DISCONTINUED] ondansetron (ZOFRAN-ODT) 4 MG disintegrating tablet      Review of Systems     Objective:   Physical Exam BP 157/76  Pulse 73  Temp(Src) 97.7 F (36.5 C) (Oral)  Ht 5\' 9"  (1.753 m)  Wt 161 lb (73.029 kg)  BMI 23.76 kg/m2       Assessment & Plan:

## 2014-09-23 NOTE — Progress Notes (Signed)
   Subjective:    Patient ID: Jose Hardin, male    DOB: Sep 14, 1930, 80 y.o.   MRN: [id number]  HPI 80 year old gentleman who is here to followup blood pressure, back pain, and lipids. Since his visit here he has been seen at the [place] and had a carotid Doppler showed no significant obstruction and he is asymptomatic. He has developed some burning and pain in his lower legs and feet that suggest neuropathy. He is not diabetic but has had lots of back problems and I suspect the neuropathy is related.    Review of Systems  Constitutional: Negative.   HENT: Negative.   Eyes: Negative.   Respiratory: Negative.  Negative for shortness of breath.   Cardiovascular: Negative.  Negative for chest pain and leg swelling.  Gastrointestinal: Negative.   Genitourinary: Negative.   Musculoskeletal: Positive for back pain.  Skin: Negative.   Neurological: Negative.   Psychiatric/Behavioral: Negative.   All other systems reviewed and are negative.      Objective:   Physical Exam  Cardiovascular:  Murmur heard. Also with carotid bruit R>L    BP 157/76  Pulse 73  Temp(Src) 97.7 F (36.5 C) (Oral)  Ht _0  (1.753 m)  Wt 161 lb (73.029 kg)  BMI 23.76 kg/m2      Assessment & Plan:  1. Essential hypertension BP meds unchanged  2. Hyperlipidemia Almost 1 yr since monitored - CMP14+EGFR - Lipid panel  3.Neuropathy  Begin neurontin 300 mg and titrate  Wardell Honour MD

## 2014-09-23 NOTE — Patient Instructions (Signed)

## 2014-09-24 LAB — CMP14+EGFR
ALBUMIN: 4.4 g/dL (ref 3.5–4.7)
ALT: 29 IU/L (ref 0–44)
AST: 39 IU/L (ref 0–40)
Albumin/Globulin Ratio: 2.3 (ref 1.1–2.5)
Alkaline Phosphatase: 51 IU/L (ref 39–117)
BILIRUBIN TOTAL: 0.6 mg/dL (ref 0.0–1.2)
BUN / CREAT RATIO: 11 (ref 10–22)
BUN: 13 mg/dL (ref 8–27)
CHLORIDE: 100 mmol/L (ref 97–108)
CO2: 26 mmol/L (ref 18–29)
Calcium: 9.1 mg/dL (ref 8.6–10.2)
Creatinine, Ser: 1.14 mg/dL (ref 0.76–1.27)
GFR calc non Af Amer: 60 mL/min/{1.73_m2} (ref >59)
GFR, EST AFRICAN AMERICAN: 69 mL/min/{1.73_m2} (ref >59)
Globulin, Total: 1.9 g/dL (ref 1.5–4.5)
Glucose: 109 mg/dL — ABNORMAL HIGH (ref 65–99)
Potassium: 4.1 mmol/L (ref 3.5–5.2)
SODIUM: 141 mmol/L (ref 134–144)
Total Protein: 6.3 g/dL (ref 6.0–8.5)

## 2014-09-24 LAB — LIPID PANEL
Chol/HDL Ratio: 3.1 ratio units (ref 0.0–5.0)
Cholesterol, Total: 138 mg/dL (ref 100–199)
HDL: 44 mg/dL (ref >39)
LDL Calculated: 77 mg/dL (ref 0–99)
Triglycerides: 87 mg/dL (ref 0–149)
VLDL Cholesterol Cal: 17 mg/dL (ref 5–40)

## 2014-10-04 ENCOUNTER — Ambulatory Visit: Payer: Medicare Other

## 2014-10-06 ENCOUNTER — Other Ambulatory Visit: Payer: Self-pay | Admitting: Family Medicine

## 2014-11-10 ENCOUNTER — Other Ambulatory Visit: Payer: Self-pay | Admitting: Family Medicine

## 2014-12-08 ENCOUNTER — Other Ambulatory Visit: Payer: Self-pay | Admitting: Family Medicine

## 2015-01-02 DIAGNOSIS — D485 Neoplasm of uncertain behavior of skin: Secondary | ICD-10-CM | POA: Diagnosis not present

## 2015-01-02 DIAGNOSIS — L989 Disorder of the skin and subcutaneous tissue, unspecified: Secondary | ICD-10-CM | POA: Diagnosis not present

## 2015-01-02 DIAGNOSIS — L570 Actinic keratosis: Secondary | ICD-10-CM | POA: Diagnosis not present

## 2015-01-02 DIAGNOSIS — L821 Other seborrheic keratosis: Secondary | ICD-10-CM | POA: Diagnosis not present

## 2015-01-10 DIAGNOSIS — L299 Pruritus, unspecified: Secondary | ICD-10-CM | POA: Diagnosis not present

## 2015-01-10 DIAGNOSIS — L309 Dermatitis, unspecified: Secondary | ICD-10-CM | POA: Diagnosis not present

## 2015-01-10 DIAGNOSIS — Z79899 Other long term (current) drug therapy: Secondary | ICD-10-CM | POA: Diagnosis not present

## 2015-01-13 ENCOUNTER — Other Ambulatory Visit: Payer: Self-pay | Admitting: *Deleted

## 2015-01-13 DIAGNOSIS — C44319 Basal cell carcinoma of skin of other parts of face: Secondary | ICD-10-CM | POA: Diagnosis not present

## 2015-01-13 DIAGNOSIS — L728 Other follicular cysts of the skin and subcutaneous tissue: Secondary | ICD-10-CM | POA: Diagnosis not present

## 2015-01-13 DIAGNOSIS — D485 Neoplasm of uncertain behavior of skin: Secondary | ICD-10-CM | POA: Diagnosis not present

## 2015-01-13 DIAGNOSIS — D044 Carcinoma in situ of skin of scalp and neck: Secondary | ICD-10-CM | POA: Diagnosis not present

## 2015-01-13 DIAGNOSIS — L570 Actinic keratosis: Secondary | ICD-10-CM | POA: Diagnosis not present

## 2015-01-13 MED ORDER — BENAZEPRIL HCL 10 MG PO TABS: 10.0000 mg | ORAL_TABLET | Freq: Every day | ORAL | Status: DC

## 2015-01-25 DIAGNOSIS — T148 Other injury of unspecified body region: Secondary | ICD-10-CM | POA: Diagnosis not present

## 2015-01-25 DIAGNOSIS — C44319 Basal cell carcinoma of skin of other parts of face: Secondary | ICD-10-CM | POA: Diagnosis not present

## 2015-01-25 DIAGNOSIS — D044 Carcinoma in situ of skin of scalp and neck: Secondary | ICD-10-CM | POA: Diagnosis not present

## 2015-01-26 ENCOUNTER — Other Ambulatory Visit: Payer: Self-pay | Admitting: Family Medicine

## 2015-03-28 ENCOUNTER — Encounter: Payer: Self-pay | Admitting: Family Medicine

## 2015-03-28 ENCOUNTER — Ambulatory Visit (INDEPENDENT_AMBULATORY_CARE_PROVIDER_SITE_OTHER): Payer: Medicare Other | Admitting: Family Medicine

## 2015-03-28 VITALS — BP 155/77 | HR 63 | Temp 97.2°F | Ht 69.0 in | Wt 163.0 lb

## 2015-03-28 DIAGNOSIS — I10 Essential (primary) hypertension: Secondary | ICD-10-CM

## 2015-03-28 DIAGNOSIS — R202 Paresthesia of skin: Secondary | ICD-10-CM | POA: Diagnosis not present

## 2015-03-28 DIAGNOSIS — E785 Hyperlipidemia, unspecified: Secondary | ICD-10-CM | POA: Diagnosis not present

## 2015-03-28 MED ORDER — MELOXICAM 7.5 MG PO TABS: 7.5000 mg | ORAL_TABLET | Freq: Every day | ORAL | Status: DC

## 2015-03-28 MED ORDER — GABAPENTIN 300 MG PO CAPS: ORAL_CAPSULE | ORAL | Status: DC

## 2015-03-28 MED ORDER — MECLIZINE HCL 25 MG PO TABS: 25.0000 mg | ORAL_TABLET | Freq: Three times a day (TID) | ORAL | Status: DC | PRN

## 2015-03-28 NOTE — Progress Notes (Signed)
Subjective:    Patient ID: Jose Hardin, male    DOB: 01/01/1930, 81 y.o.   MRN: [id number]  HPI 81 year old gentleman here to follow-up hypertension. He takes care of his wife at home who has MS. He is followed by the [facility] for most of his medical problems. At his last visit we instituted Neurontin for what sounds like neuropathy in his lower legs and feet secondary to back problems. He only took 300 Neurontin and did not see a response stopped. I explained that we probably need to titrate the dose upward if there is no response to the lower dose.  He also complains of some left-sided chest pain that hurts when he coughs or sneezes. There is no pain of ischemic nature by history.  Patient Active Problem List   Diagnosis Date Noted  . Carotid bruit present   . Hypertension   . BPH (benign prostatic hyperplasia)   . Colovesical fistula   . Vertebral compression fracture   . Hyperlipidemia   . Osteoporosis   . Heart murmur   . GERD (gastroesophageal reflux disease)   . Right carotid bruit 05/07/2013  . HTN (hypertension) 05/07/2013  . HLD (hyperlipidemia) 05/07/2013  . Insomnia 05/07/2013  . Prediabetes 05/07/2013  . Back pain 05/07/2013  . Paresthesia of bilateral legs 05/07/2013   Outpatient Encounter Prescriptions as of 03/28/2015  Medication Sig  . amLODipine (NORVASC) 5 MG tablet TAKE 1 TABLET ONCE DAILY.  Marland Kitchen aspirin 325 MG tablet Take 325 mg by mouth daily.  Marland Kitchen atorvastatin (LIPITOR) 40 MG tablet TAKE 1 TABLET ONCE DAILY.  . benazepril (LOTENSIN) 10 MG tablet Take 1 tablet (10 mg total) by mouth daily.  . cholecalciferol (VITAMIN D) 1000 UNITS tablet Take 1,000 Units by mouth daily.  . fluticasone (FLONASE) 50 MCG/ACT nasal spray Place 1 spray into both nostrils daily.  . meclizine (ANTIVERT) 25 MG tablet TAKE 1 TABLET THREE TIMES DAILY FOR 10 DAYS.  Marland Kitchen methotrexate (RHEUMATREX) 2.5 MG tablet Take 2.5 mg by mouth. 2 tablets weekly  . Multiple Vitamin (MULTI-VITAMIN DAILY  PO) Take 1 tablet by mouth daily.  Marland Kitchen omeprazole (PRILOSEC) 20 MG capsule TAKE (1) CAPSULE DAILY.  . traMADol (ULTRAM) 50 MG tablet Take one tablet twice a day  . zolpidem (AMBIEN) 10 MG tablet Take 10 mg by mouth at bedtime as needed.   . [DISCONTINUED] alendronate (FOSAMAX) 70 MG tablet TAKE 1 TABLET ONCE A WEEK AS DIRECTED .  Marland Kitchen [DISCONTINUED] fluorometholone (FML) 0.1 % ophthalmic suspension   . [DISCONTINUED] gabapentin (NEURONTIN) 300 MG capsule Take 1 capsule (300 mg total) by mouth daily.     Review of Systems  Constitutional: Negative.   HENT: Negative.   Respiratory: Negative.   Cardiovascular: Positive for chest pain.  Gastrointestinal: Negative.   Neurological: Negative.   Psychiatric/Behavioral: Negative.        Objective:   Physical Exam  Constitutional: He is oriented to person, place, and time. He appears well-developed.  Cardiovascular: Normal rate.   Murmur heard. Pulmonary/Chest: He exhibits tenderness.  Neurological: He is alert and oriented to person, place, and time.  Monofilament testing to his feet shows no sensory loss     BP 155/77 mmHg  Pulse 63  Temp(Src) 97.2 F (36.2 C) (Oral)  Ht 5\' 9"  (1.753 m)  Wt 163 lb (73.936 kg)  BMI 24.06 kg/m2         Assessment & Plan:  1. Essential hypertension pressure continues the same he checks pressures at  home and generally it's in the 48/25/0037 range systolic and I am content to leave medications alone given that piece of history.   2. Hyperlipidemia Lipids are at goal on atorvastatin  3. Paresthesia of bilateral legs Will add back Neurontin at 600 mg, that is 300 at bedtime and 300 and a.m. and titrate. Hopefully we will see some response to his neuropathic symptoms  Wardell Honour MD

## 2015-04-03 ENCOUNTER — Telehealth: Payer: Self-pay | Admitting: Family Medicine

## 2015-04-03 NOTE — Telephone Encounter (Signed)
Stp and he says every time he takes the gabapentin he gets dizzy and has blurred vision. He stopped taking the gabapentin and hasn't had any blurred vision or dizziness. Is there anything else we can try for the burning in his legs?

## 2015-04-04 NOTE — Telephone Encounter (Signed)
Other meds used would likely cause more of the same side effects

## 2015-04-04 NOTE — Telephone Encounter (Signed)
Pt aware of Dr. Sanjuan Dame recommendation, will see how he does

## 2015-04-06 ENCOUNTER — Other Ambulatory Visit: Payer: Self-pay | Admitting: Family Medicine

## 2015-04-07 DIAGNOSIS — D044 Carcinoma in situ of skin of scalp and neck: Secondary | ICD-10-CM | POA: Diagnosis not present

## 2015-04-13 ENCOUNTER — Other Ambulatory Visit: Payer: Self-pay | Admitting: Family Medicine

## 2015-04-27 ENCOUNTER — Other Ambulatory Visit: Payer: Self-pay | Admitting: Family Medicine

## 2015-05-11 ENCOUNTER — Other Ambulatory Visit: Payer: Self-pay | Admitting: Family Medicine

## 2015-06-13 DIAGNOSIS — Z79899 Other long term (current) drug therapy: Secondary | ICD-10-CM | POA: Diagnosis not present

## 2015-06-13 DIAGNOSIS — L299 Pruritus, unspecified: Secondary | ICD-10-CM | POA: Diagnosis not present

## 2015-06-13 DIAGNOSIS — L309 Dermatitis, unspecified: Secondary | ICD-10-CM | POA: Diagnosis not present

## 2015-06-13 DIAGNOSIS — L570 Actinic keratosis: Secondary | ICD-10-CM | POA: Diagnosis not present

## 2015-06-13 DIAGNOSIS — L853 Xerosis cutis: Secondary | ICD-10-CM | POA: Diagnosis not present

## 2015-06-15 ENCOUNTER — Other Ambulatory Visit: Payer: Self-pay | Admitting: Family Medicine

## 2015-06-15 NOTE — Telephone Encounter (Signed)
LAST SEEN 03/28/15 Dr Sabra Heck  This med not on EPIC list

## 2015-07-12 DIAGNOSIS — L608 Other nail disorders: Secondary | ICD-10-CM | POA: Diagnosis not present

## 2015-07-12 DIAGNOSIS — Z85828 Personal history of other malignant neoplasm of skin: Secondary | ICD-10-CM | POA: Diagnosis not present

## 2015-07-12 DIAGNOSIS — L570 Actinic keratosis: Secondary | ICD-10-CM | POA: Diagnosis not present

## 2015-07-14 ENCOUNTER — Ambulatory Visit (INDEPENDENT_AMBULATORY_CARE_PROVIDER_SITE_OTHER): Payer: Medicare Other | Admitting: Family Medicine

## 2015-07-14 ENCOUNTER — Encounter: Payer: Self-pay | Admitting: Family Medicine

## 2015-07-14 DIAGNOSIS — I10 Essential (primary) hypertension: Secondary | ICD-10-CM

## 2015-07-14 DIAGNOSIS — E785 Hyperlipidemia, unspecified: Secondary | ICD-10-CM

## 2015-07-14 NOTE — Progress Notes (Signed)
Subjective:    Patient ID: Jose Hardin, male    DOB: 1930/07/10, 81 y.o.   MRN: [id number]  HPI 81 year old gentleman here to follow-up his neuropathy. We think the neuropathy probably comes from some spinal stenosis. He was to bring a copy of the MRI that he had at the [place] but he brought the wrong paper. He could not take the Neurontin as it made him dizzy. The same is true with Lyrica. He tells me he rubs some capsaicin cream at night and that tends to help and I encouraged continued use of that.    Review of Systems  Constitutional: Negative.   HENT: Negative.   Respiratory: Negative.   Cardiovascular: Negative.   Endocrine: Positive for polyuria.  Neurological: Negative.   Psychiatric/Behavioral: Negative.    Patient Active Problem List   Diagnosis Date Noted  . Carotid bruit present   . Hypertension   . BPH (benign prostatic hyperplasia)   . Colovesical fistula   . Vertebral compression fracture   . Hyperlipidemia   . Osteoporosis   . Heart murmur   . GERD (gastroesophageal reflux disease)   . Right carotid bruit 05/07/2013  . HTN (hypertension) 05/07/2013  . HLD (hyperlipidemia) 05/07/2013  . Insomnia 05/07/2013  . Prediabetes 05/07/2013  . Back pain 05/07/2013  . Paresthesia of bilateral legs 05/07/2013   Outpatient Encounter Prescriptions as of 07/14/2015  Medication Sig  . alendronate (FOSAMAX) 70 MG tablet TAKE ONE TABLET EVERY WEEK.  Marland Kitchen amLODipine (NORVASC) 5 MG tablet TAKE 1 TABLET ONCE DAILY.  Marland Kitchen aspirin 325 MG tablet Take 325 mg by mouth daily.  Marland Kitchen atorvastatin (LIPITOR) 40 MG tablet TAKE 1 TABLET ONCE DAILY.  . benazepril (LOTENSIN) 10 MG tablet TAKE 1 TABLET ONCE DAILY.  . cholecalciferol (VITAMIN D) 1000 UNITS tablet Take 1,000 Units by mouth daily.  . fluticasone (FLONASE) 50 MCG/ACT nasal spray Place 1 spray into both nostrils daily.  . meclizine (ANTIVERT) 25 MG tablet Take 1 tablet (25 mg total) by mouth 3 (three) times daily as needed for dizziness.    . meloxicam (MOBIC) 7.5 MG tablet Take 1 tablet (7.5 mg total) by mouth daily.  . methotrexate (RHEUMATREX) 2.5 MG tablet Take 2.5 mg by mouth. 2 tablets weekly  . Multiple Vitamin (MULTI-VITAMIN DAILY PO) Take 1 tablet by mouth daily.  Marland Kitchen omeprazole (PRILOSEC) 20 MG capsule TAKE (1) CAPSULE DAILY.  . traMADol (ULTRAM) 50 MG tablet Take one tablet twice a day  . zolpidem (AMBIEN) 10 MG tablet Take 10 mg by mouth at bedtime as needed.   . gabapentin (NEURONTIN) 300 MG capsule Take one tablet in the morning and one at bedtime (Patient not taking: Reported on 07/14/2015)   No facility-administered encounter medications on file as of 07/14/2015.       Objective:   Physical Exam  Constitutional: He is oriented to person, place, and time. He appears well-developed and well-nourished.  Cardiovascular: Normal rate and regular rhythm.   Pulmonary/Chest: Effort normal and breath sounds normal.  Neurological: He is alert and oriented to person, place, and time.  Psychiatric: He has a normal mood and affect.          Assessment & Plan:  1. Essential hypertension . No changes are needed Sugars adequately controlled on current regimen  2. HLD (hyperlipidemia) Patient is taking a statin. Levels were last checked in September 2015 and were at goal at that time - Lipid panel - Hepatic function panel  Wardell Honour MD

## 2015-07-15 LAB — HEPATIC FUNCTION PANEL
ALT: 20 IU/L (ref 0–44)
AST: 34 IU/L (ref 0–40)
Albumin: 4.4 g/dL (ref 3.5–4.7)
Alkaline Phosphatase: 59 IU/L (ref 39–117)
BILIRUBIN TOTAL: 0.7 mg/dL (ref 0.0–1.2)
BILIRUBIN, DIRECT: 0.21 mg/dL (ref 0.00–0.40)
TOTAL PROTEIN: 6.2 g/dL (ref 6.0–8.5)

## 2015-07-15 LAB — LIPID PANEL
CHOLESTEROL TOTAL: 118 mg/dL (ref 100–199)
Chol/HDL Ratio: 2.7 ratio units (ref 0.0–5.0)
HDL: 43 mg/dL (ref >39)
LDL CALC: 62 mg/dL (ref 0–99)
TRIGLYCERIDES: 67 mg/dL (ref 0–149)
VLDL CHOLESTEROL CAL: 13 mg/dL (ref 5–40)

## 2015-07-17 ENCOUNTER — Encounter: Payer: Self-pay | Admitting: *Deleted

## 2015-07-27 DIAGNOSIS — H578 Other specified disorders of eye and adnexa: Secondary | ICD-10-CM | POA: Diagnosis not present

## 2015-09-07 ENCOUNTER — Other Ambulatory Visit: Payer: Self-pay | Admitting: Family Medicine

## 2015-09-07 ENCOUNTER — Encounter: Payer: Self-pay | Admitting: *Deleted

## 2015-09-19 DIAGNOSIS — L309 Dermatitis, unspecified: Secondary | ICD-10-CM | POA: Diagnosis not present

## 2015-09-19 DIAGNOSIS — Z79899 Other long term (current) drug therapy: Secondary | ICD-10-CM | POA: Diagnosis not present

## 2015-09-19 DIAGNOSIS — Z5181 Encounter for therapeutic drug level monitoring: Secondary | ICD-10-CM | POA: Diagnosis not present

## 2015-09-19 DIAGNOSIS — L853 Xerosis cutis: Secondary | ICD-10-CM | POA: Diagnosis not present

## 2015-09-19 DIAGNOSIS — L570 Actinic keratosis: Secondary | ICD-10-CM | POA: Diagnosis not present

## 2015-09-19 DIAGNOSIS — L299 Pruritus, unspecified: Secondary | ICD-10-CM | POA: Diagnosis not present

## 2015-10-03 ENCOUNTER — Encounter: Payer: Self-pay | Admitting: Family Medicine

## 2015-10-03 ENCOUNTER — Ambulatory Visit (INDEPENDENT_AMBULATORY_CARE_PROVIDER_SITE_OTHER): Payer: Medicare Other | Admitting: Family Medicine

## 2015-10-03 VITALS — BP 122/70 | HR 63 | Temp 97.1°F | Ht 69.0 in | Wt 157.4 lb

## 2015-10-03 DIAGNOSIS — Z0000 Encounter for general adult medical examination without abnormal findings: Secondary | ICD-10-CM | POA: Diagnosis not present

## 2015-10-03 DIAGNOSIS — Z23 Encounter for immunization: Secondary | ICD-10-CM | POA: Diagnosis not present

## 2015-10-03 NOTE — Progress Notes (Signed)
Subjective:    Jose Hardin is a 81 y.o. male who presents for Medicare Annual/Subsequent preventive examination.   Preventive Screening-Counseling & Management  Tobacco History  Smoking status  . Former Smoker  . Quit date: 12/30/1981  Smokeless tobacco  . Never Used    Problems Prior to Visit 1. See Below  Current Problems (verified) Patient Active Problem List   Diagnosis Date Noted  . Carotid bruit present   . Hypertension   . BPH (benign prostatic hyperplasia)   . Colovesical fistula   . Vertebral compression fracture ([place])   . Hyperlipidemia   . Osteoporosis   . Heart murmur   . GERD (gastroesophageal reflux disease)   . Right carotid bruit 05/07/2013  . HTN (hypertension) 05/07/2013  . HLD (hyperlipidemia) 05/07/2013  . Insomnia 05/07/2013  . Prediabetes 05/07/2013  . Back pain 05/07/2013  . Paresthesia of bilateral legs 05/07/2013    Medications Prior to Visit Current Outpatient Prescriptions on File Prior to Visit  Medication Sig Dispense Refill  . alendronate (FOSAMAX) 70 MG tablet TAKE 1 TABLET BY MOUTH ONCE A WEEK AS DIRECTED. 4 tablet 3  . amLODipine (NORVASC) 5 MG tablet TAKE 1 TABLET ONCE DAILY. 30 tablet 4  . aspirin 325 MG tablet Take 325 mg by mouth daily.    Marland Kitchen atorvastatin (LIPITOR) 40 MG tablet TAKE 1 TABLET ONCE DAILY. 90 tablet 2  . benazepril (LOTENSIN) 10 MG tablet TAKE 1 TABLET ONCE DAILY. 30 tablet 5  . cholecalciferol (VITAMIN D) 1000 UNITS tablet Take 1,000 Units by mouth daily.    . fluticasone (FLONASE) 50 MCG/ACT nasal spray Place 1 spray into both nostrils daily. 16 g 4  . meclizine (ANTIVERT) 25 MG tablet Take 1 tablet (25 mg total) by mouth 3 (three) times daily as needed for dizziness. 90 tablet 0  . Multiple Vitamin (MULTI-VITAMIN DAILY PO) Take 1 tablet by mouth daily.    Marland Kitchen omeprazole (PRILOSEC) 20 MG capsule TAKE (1) CAPSULE DAILY. 30 capsule 5  . traMADol (ULTRAM) 50 MG tablet Take one tablet twice a day 60 tablet 0  .  zolpidem (AMBIEN) 10 MG tablet Take 10 mg by mouth at bedtime as needed.      No current facility-administered medications on file prior to visit.    Current Medications (verified) Current Outpatient Prescriptions  Medication Sig Dispense Refill  . alendronate (FOSAMAX) 70 MG tablet TAKE 1 TABLET BY MOUTH ONCE A WEEK AS DIRECTED. 4 tablet 3  . amLODipine (NORVASC) 5 MG tablet TAKE 1 TABLET ONCE DAILY. 30 tablet 4  . aspirin 325 MG tablet Take 325 mg by mouth daily.    Marland Kitchen atorvastatin (LIPITOR) 40 MG tablet TAKE 1 TABLET ONCE DAILY. 90 tablet 2  . benazepril (LOTENSIN) 10 MG tablet TAKE 1 TABLET ONCE DAILY. 30 tablet 5  . cholecalciferol (VITAMIN D) 1000 UNITS tablet Take 1,000 Units by mouth daily.    . fluticasone (FLONASE) 50 MCG/ACT nasal spray Place 1 spray into both nostrils daily. 16 g 4  . meclizine (ANTIVERT) 25 MG tablet Take 1 tablet (25 mg total) by mouth 3 (three) times daily as needed for dizziness. 90 tablet 0  . Multiple Vitamin (MULTI-VITAMIN DAILY PO) Take 1 tablet by mouth daily.    Marland Kitchen omeprazole (PRILOSEC) 20 MG capsule TAKE (1) CAPSULE DAILY. 30 capsule 5  . traMADol (ULTRAM) 50 MG tablet Take one tablet twice a day 60 tablet 0  . zolpidem (AMBIEN) 10 MG tablet Take 10 mg by mouth  at bedtime as needed.      No current facility-administered medications for this visit.     Allergies (verified) Pregabalin and Soap   PAST HISTORY  Family History History reviewed. No pertinent family history.  Social History Social History  Substance Use Topics  . Smoking status: Former Smoker    Quit date: 12/30/1981  . Smokeless tobacco: Never Used  . Alcohol Use: No    Are there smokers in your home (other than you)?  No  Risk Factors Current exercise habits: Home exercise routine includes walking 0.5 hrs per days. 5 days per week  Dietary issues discussed: None   Cardiac risk factors: advanced age (older than 49 for men, 26 for women), dyslipidemia and  hypertension.  Depression Screen (Note: if answer to either of the following is "Yes", a more complete depression screening is indicated)   Q1: Over the past two weeks, have you felt down, depressed or hopeless? No  Q2: Over the past two weeks, have you felt little interest or pleasure in doing things? No  Have you lost interest or pleasure in daily life? No  Do you often feel hopeless? No  Do you cry easily over simple problems? No  Activities of Daily Living In your present state of health, do you have any difficulty performing the following activities?:  Driving? No Managing money?  No Feeding yourself? No Getting from bed to chair? No Climbing a flight of stairs? No Preparing food and eating?: No Bathing or showering? No Getting dressed: No Getting to the toilet? No Using the toilet:No Moving around from place to place: No In the past year have you fallen or had a near fall?:No   Are you sexually active?  Yes  Do you have more than one partner?  No  Hearing Difficulties: has hearing aids Do you often ask people to speak up or repeat themselves? No Do you experience ringing or noises in your ears? No Do you have difficulty understanding soft or whispered voices? No   Do you feel that you have a problem with memory? No  Do you often misplace items? No  Do you feel safe at home?  Yes  Cognitive Testing  Alert? Yes  Normal Appearance?Yes  Oriented to person? Yes  Place? Yes   Time? Yes  Recall of three objects?  No 2/3  Can perform simple calculations? Yes  Displays appropriate judgment?Yes  Can read the correct time from a watch face?No   Advanced Directives have been discussed with the patient? Yes   List the Names of Other Physician/Practitioners you currently use: 1.  See's the [place] as well.   Indicate any recent Medical Services you may have received from other than [facility] providers in the past year (date may be approximate).  Immunization History  Administered  Date(s) Administered  . Influenza,inj,Quad PF,36+ Mos 09/29/2013  . Influenza-Unspecified 10/31/2014  . Pneumococcal Conjugate-13 01/30/2014  . Pneumococcal Polysaccharide-23 12/30/2009  . Tdap 09/30/2011    Screening Tests Health Maintenance  Topic Date Due  . INFLUENZA VACCINE  07/31/2015  . TETANUS/TDAP  09/29/2021  . ZOSTAVAX  Addressed  . PNA vac Low Risk Adult  Completed    All answers were reviewed with the patient and necessary referrals were made:  Kenn File, MD   10/03/2015   History reviewed: allergies, current medications, past family history, past medical history, past social history, past surgical history and problem list  Review of Systems Pertinent items are noted in HPI.  Objective:      Blood pressure 122/70, pulse 63, temperature 97.1 F (36.2 C), temperature source Oral, height 5\' 9"  (1.753 m), weight 157 lb 6.4 oz (71.396 kg). Body mass index is 23.23 kg/(m^2).  Gen: NAD, alert, cooperative with exam HEENT: NCAT, EOMI, PERRL CV: RRR, 0-3/5 systolic murmur radiating to the carotids Resp: CTABL, no wheezes, non-labored Ext: No edema, warm Neuro: Alert and oriented, No gross deficits      Assessment:     Mr. Sawchuk is a very pleasant 81 year old male here for his Medicare annual wellness visit. He is in good health overall and has no complaints. 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   He scored a 26/30 on his MMSE- Normal above 25.      Plan:     During the course of the visit the patient was educated and counseled about appropriate screening and preventive services including:    Influenza vaccine  has a living will  Diet review for nutrition referral? No   Patient Instructions (the written plan) was given to the patient.  Medicare Attestation I have personally reviewed: The patient's medical and social history Their use of alcohol, tobacco or illicit drugs Their current medications and supplements The  patient's functional ability including ADLs,fall risks, home safety risks, cognitive, and hearing and visual impairment Diet and physical activities Evidence for depression or mood disorders  The patient's weight, height, BMI, and visual acuity have been recorded in the chart.  I have made referrals, counseling, and provided education to the patient based on review of the above and I have provided the patient with a written personalized care plan for preventive services.     Kenn File, MD   10/03/2015

## 2015-10-03 NOTE — Patient Instructions (Signed)
Great to meet you! 

## 2015-11-02 ENCOUNTER — Other Ambulatory Visit: Payer: Self-pay | Admitting: Family Medicine

## 2015-11-16 ENCOUNTER — Other Ambulatory Visit: Payer: Self-pay | Admitting: Family Medicine

## 2015-11-22 ENCOUNTER — Other Ambulatory Visit: Payer: Self-pay | Admitting: Family Medicine

## 2016-01-02 DIAGNOSIS — L298 Other pruritus: Secondary | ICD-10-CM | POA: Diagnosis not present

## 2016-01-02 DIAGNOSIS — Z5181 Encounter for therapeutic drug level monitoring: Secondary | ICD-10-CM | POA: Diagnosis not present

## 2016-01-02 DIAGNOSIS — L821 Other seborrheic keratosis: Secondary | ICD-10-CM | POA: Diagnosis not present

## 2016-01-02 DIAGNOSIS — L309 Dermatitis, unspecified: Secondary | ICD-10-CM | POA: Diagnosis not present

## 2016-01-02 DIAGNOSIS — L570 Actinic keratosis: Secondary | ICD-10-CM | POA: Diagnosis not present

## 2016-01-02 DIAGNOSIS — L299 Pruritus, unspecified: Secondary | ICD-10-CM | POA: Diagnosis not present

## 2016-01-02 DIAGNOSIS — Z79899 Other long term (current) drug therapy: Secondary | ICD-10-CM | POA: Diagnosis not present

## 2016-01-17 ENCOUNTER — Ambulatory Visit: Payer: Medicare Other | Admitting: Family Medicine

## 2016-01-19 ENCOUNTER — Ambulatory Visit (INDEPENDENT_AMBULATORY_CARE_PROVIDER_SITE_OTHER): Payer: Medicare Other | Admitting: Family Medicine

## 2016-01-19 ENCOUNTER — Encounter: Payer: Self-pay | Admitting: Family Medicine

## 2016-01-19 VITALS — BP 131/67 | HR 67 | Temp 97.4°F | Ht 69.0 in | Wt 162.0 lb

## 2016-01-19 DIAGNOSIS — R200 Anesthesia of skin: Secondary | ICD-10-CM

## 2016-01-19 DIAGNOSIS — I10 Essential (primary) hypertension: Secondary | ICD-10-CM

## 2016-01-19 DIAGNOSIS — E785 Hyperlipidemia, unspecified: Secondary | ICD-10-CM

## 2016-01-19 DIAGNOSIS — R202 Paresthesia of skin: Secondary | ICD-10-CM

## 2016-01-19 MED ORDER — GABAPENTIN 300 MG PO CAPS: ORAL_CAPSULE | ORAL | Status: DC

## 2016-01-19 NOTE — Patient Instructions (Signed)
Medicare Annual Wellness Visit   and the medical providers at [facility] strive to bring you the best medical care.  In doing so we not only want to address your current medical conditions and concerns but also to detect new conditions early and prevent illness, disease and health-related problems.    Medicare offers a yearly Wellness Visit which allows our clinical staff to assess your need for preventative services including immunizations, lifestyle education, counseling to decrease risk of preventable diseases and screening for fall risk and other medical concerns.    This visit is provided free of charge (no copay) for all Medicare recipients. The clinical pharmacists at [facility] have begun to conduct these Wellness Visits which will also include a thorough review of all your medications.    As you primary medical provider recommend that you make an appointment for your Annual Wellness Visit if you have not done so already this year.  You may set up this appointment before you leave today or you may call back ([phone]) and schedule an appointment.  Please make sure when you call that you mention that you are scheduling your Annual Wellness Visit with the clinical pharmacist so that the appointment may be made for the proper length of time.     Continue current medications. Continue good therapeutic lifestyle changes which include good diet and exercise. Fall precautions discussed with patient. If an FOBT was given today- please return it to our front desk. If you are over 50 years old - you may need Prevnar 13 or the adult Pneumonia vaccine.  **Flu shots are available--- please call and schedule a FLU-CLINIC appointment**  After your visit with us today you will receive a survey in the mail or online from [employer] regarding your care with us. Please take a moment to fill this out. Your feedback is very  important to us as you can help us better understand your patient needs as well as improve your experience and satisfaction. WE CARE ABOUT YOU!!!    

## 2016-01-19 NOTE — Progress Notes (Signed)
Subjective:    Patient ID: Jose Hardin, male    DOB: 07-02-1930, 82 y.o.   MRN: [id number]  HPI Pt here for follow up and management of chronic medical problems which includes hypertension and hyperlipidemia. He is taking medications regularly. His only new complaint today is some burning on the lateral aspect of both lower legs. He does not have diabetes. He has never had any issues with B12 deficiency. He has had chronic back pain.      Patient Active Problem List   Diagnosis Date Noted  . Carotid bruit present   . Hypertension   . BPH (benign prostatic hyperplasia)   . Colovesical fistula   . Vertebral compression fracture ([place])   . Hyperlipidemia   . Osteoporosis   . Heart murmur   . GERD (gastroesophageal reflux disease)   . Right carotid bruit 05/07/2013  . HTN (hypertension) 05/07/2013  . HLD (hyperlipidemia) 05/07/2013  . Insomnia 05/07/2013  . Prediabetes 05/07/2013  . Back pain 05/07/2013  . Paresthesia of bilateral legs 05/07/2013   Outpatient Encounter Prescriptions as of 01/19/2016  Medication Sig  . alendronate (FOSAMAX) 70 MG tablet TAKE 1 TABLET BY MOUTH ONCE A WEEK AS DIRECTED.  Marland Kitchen amLODipine (NORVASC) 5 MG tablet TAKE 1 TABLET ONCE DAILY.  Marland Kitchen aspirin 325 MG tablet Take 325 mg by mouth daily.  Marland Kitchen atorvastatin (LIPITOR) 40 MG tablet TAKE 1 TABLET ONCE DAILY.  . benazepril (LOTENSIN) 10 MG tablet TAKE 1 TABLET ONCE DAILY.  . cholecalciferol (VITAMIN D) 1000 UNITS tablet Take 1,000 Units by mouth daily.  . fluticasone (FLONASE) 50 MCG/ACT nasal spray Place 1 spray into both nostrils daily.  . meclizine (ANTIVERT) 25 MG tablet Take 1 tablet (25 mg total) by mouth 3 (three) times daily as needed for dizziness.  . Multiple Vitamin (MULTI-VITAMIN DAILY PO) Take 1 tablet by mouth daily.  Marland Kitchen omeprazole (PRILOSEC) 20 MG capsule TAKE (1) CAPSULE DAILY.  . traMADol (ULTRAM) 50 MG tablet Take one tablet twice a day  . zolpidem (AMBIEN) 10 MG tablet Take 10 mg by mouth  at bedtime as needed.    No facility-administered encounter medications on file as of 01/19/2016.      Review of Systems  Constitutional: Negative.   HENT: Negative.   Eyes: Negative.   Respiratory: Negative.   Cardiovascular: Negative.   Gastrointestinal: Negative.   Endocrine: Negative.   Genitourinary: Negative.   Musculoskeletal: Negative.   Skin: Negative.   Allergic/Immunologic: Negative.   Neurological: Negative.        Burning in legs  Hematological: Negative.   Psychiatric/Behavioral: Negative.        Objective:   Physical Exam  Constitutional: He is oriented to person, place, and time. He appears well-developed and well-nourished.  HENT:  Head: Normocephalic.  Neck:  Right carotid bruit (history of same)  Cardiovascular: Normal rate and regular rhythm.   Pulmonary/Chest: Effort normal and breath sounds normal.  Abdominal: Soft.  Neurological: He is alert and oriented to person, place, and time.  Psychiatric: He has a normal mood and affect. His behavior is normal.   BP 131/67 mmHg  Pulse 67  Temp(Src) 97.4 F (36.3 C) (Oral)  Ht _0  (1.753 m)  Wt 162 lb (73.483 kg)  BMI 23.91 kg/m2        Assessment & Plan:  1. HLD (hyperlipidemia) Lipids were at goal in last July. Expect same results - Lipid panel  2. Essential hypertension [place] well controlled on current regimen continue  same - CMP14+EGFR  3. Numbness and tingling of leg Patient has neuropathy. Would expect it's from back. Begin Neurontin 300 mg at bedtime after 2 weeks if symptoms aren't improved and morning dose of 300 mg  Wardell Honour MD - Vitamin B12

## 2016-01-20 LAB — CMP14+EGFR
ALK PHOS: 68 IU/L (ref 39–117)
ALT: 21 IU/L (ref 0–44)
AST: 32 IU/L (ref 0–40)
Albumin/Globulin Ratio: 2.1 (ref 1.1–2.5)
Albumin: 4.2 g/dL (ref 3.5–4.7)
BUN / CREAT RATIO: 12 (ref 10–22)
BUN: 15 mg/dL (ref 8–27)
Bilirubin Total: 0.8 mg/dL (ref 0.0–1.2)
CO2: 28 mmol/L (ref 18–29)
Calcium: 8.6 mg/dL (ref 8.6–10.2)
Chloride: 103 mmol/L (ref 96–106)
Creatinine, Ser: 1.25 mg/dL (ref 0.76–1.27)
GFR calc Af Amer: 61 mL/min/{1.73_m2} (ref >59)
GFR calc non Af Amer: 53 mL/min/{1.73_m2} — ABNORMAL LOW (ref >59)
GLOBULIN, TOTAL: 2 g/dL (ref 1.5–4.5)
GLUCOSE: 127 mg/dL — AB (ref 65–99)
POTASSIUM: 4.6 mmol/L (ref 3.5–5.2)
Sodium: 142 mmol/L (ref 134–144)
Total Protein: 6.2 g/dL (ref 6.0–8.5)

## 2016-01-20 LAB — LIPID PANEL
CHOL/HDL RATIO: 2.8 ratio (ref 0.0–5.0)
Cholesterol, Total: 111 mg/dL (ref 100–199)
HDL: 39 mg/dL — ABNORMAL LOW (ref >39)
LDL Calculated: 61 mg/dL (ref 0–99)
Triglycerides: 57 mg/dL (ref 0–149)
VLDL CHOLESTEROL CAL: 11 mg/dL (ref 5–40)

## 2016-01-20 LAB — VITAMIN B12: VITAMIN B 12: 435 pg/mL (ref 211–946)

## 2016-03-21 ENCOUNTER — Other Ambulatory Visit: Payer: Self-pay | Admitting: Family Medicine

## 2016-04-30 DIAGNOSIS — L821 Other seborrheic keratosis: Secondary | ICD-10-CM | POA: Diagnosis not present

## 2016-04-30 DIAGNOSIS — L309 Dermatitis, unspecified: Secondary | ICD-10-CM | POA: Diagnosis not present

## 2016-04-30 DIAGNOSIS — Z79899 Other long term (current) drug therapy: Secondary | ICD-10-CM | POA: Diagnosis not present

## 2016-04-30 DIAGNOSIS — L299 Pruritus, unspecified: Secondary | ICD-10-CM | POA: Diagnosis not present

## 2016-04-30 DIAGNOSIS — Z5181 Encounter for therapeutic drug level monitoring: Secondary | ICD-10-CM | POA: Diagnosis not present

## 2016-04-30 DIAGNOSIS — L570 Actinic keratosis: Secondary | ICD-10-CM | POA: Diagnosis not present

## 2016-04-30 DIAGNOSIS — L298 Other pruritus: Secondary | ICD-10-CM | POA: Diagnosis not present

## 2016-05-02 ENCOUNTER — Other Ambulatory Visit: Payer: Self-pay | Admitting: Family Medicine

## 2016-06-07 ENCOUNTER — Other Ambulatory Visit: Payer: Self-pay | Admitting: Family Medicine

## 2016-07-25 ENCOUNTER — Other Ambulatory Visit: Payer: Self-pay | Admitting: Family Medicine

## 2016-08-08 ENCOUNTER — Other Ambulatory Visit: Payer: Self-pay | Admitting: Family Medicine

## 2016-08-15 ENCOUNTER — Other Ambulatory Visit: Payer: Self-pay | Admitting: Family Medicine

## 2016-08-28 ENCOUNTER — Other Ambulatory Visit: Payer: Self-pay | Admitting: Family Medicine

## 2016-09-03 DIAGNOSIS — L299 Pruritus, unspecified: Secondary | ICD-10-CM | POA: Diagnosis not present

## 2016-09-03 DIAGNOSIS — L821 Other seborrheic keratosis: Secondary | ICD-10-CM | POA: Diagnosis not present

## 2016-09-03 DIAGNOSIS — Z79899 Other long term (current) drug therapy: Secondary | ICD-10-CM | POA: Diagnosis not present

## 2016-09-03 DIAGNOSIS — L570 Actinic keratosis: Secondary | ICD-10-CM | POA: Diagnosis not present

## 2016-09-03 DIAGNOSIS — L309 Dermatitis, unspecified: Secondary | ICD-10-CM | POA: Diagnosis not present

## 2016-09-30 ENCOUNTER — Other Ambulatory Visit: Payer: Self-pay | Admitting: Family Medicine

## 2016-11-07 ENCOUNTER — Other Ambulatory Visit: Payer: Self-pay | Admitting: Family Medicine

## 2016-11-14 ENCOUNTER — Other Ambulatory Visit: Payer: Self-pay | Admitting: Family Medicine

## 2016-11-26 ENCOUNTER — Encounter: Payer: Self-pay | Admitting: Family Medicine

## 2016-11-26 ENCOUNTER — Ambulatory Visit (INDEPENDENT_AMBULATORY_CARE_PROVIDER_SITE_OTHER): Payer: Medicare Other | Admitting: Family Medicine

## 2016-11-26 VITALS — BP 103/63 | HR 108 | Temp 96.7°F | Ht 69.0 in | Wt 159.0 lb

## 2016-11-26 DIAGNOSIS — I63311 Cerebral infarction due to thrombosis of right middle cerebral artery: Secondary | ICD-10-CM | POA: Diagnosis not present

## 2016-11-26 DIAGNOSIS — M545 Low back pain, unspecified: Secondary | ICD-10-CM

## 2016-11-26 DIAGNOSIS — Z7982 Long term (current) use of aspirin: Secondary | ICD-10-CM | POA: Diagnosis not present

## 2016-11-26 DIAGNOSIS — I9589 Other hypotension: Secondary | ICD-10-CM | POA: Diagnosis not present

## 2016-11-26 DIAGNOSIS — Z7983 Long term (current) use of bisphosphonates: Secondary | ICD-10-CM | POA: Diagnosis not present

## 2016-11-26 DIAGNOSIS — G4700 Insomnia, unspecified: Secondary | ICD-10-CM | POA: Diagnosis not present

## 2016-11-26 DIAGNOSIS — D72829 Elevated white blood cell count, unspecified: Secondary | ICD-10-CM | POA: Diagnosis not present

## 2016-11-26 DIAGNOSIS — R531 Weakness: Secondary | ICD-10-CM | POA: Diagnosis not present

## 2016-11-26 DIAGNOSIS — Z23 Encounter for immunization: Secondary | ICD-10-CM | POA: Diagnosis not present

## 2016-11-26 DIAGNOSIS — R0601 Orthopnea: Secondary | ICD-10-CM | POA: Diagnosis not present

## 2016-11-26 DIAGNOSIS — G3184 Mild cognitive impairment, so stated: Secondary | ICD-10-CM | POA: Diagnosis not present

## 2016-11-26 DIAGNOSIS — Z9049 Acquired absence of other specified parts of digestive tract: Secondary | ICD-10-CM | POA: Diagnosis not present

## 2016-11-26 DIAGNOSIS — I517 Cardiomegaly: Secondary | ICD-10-CM | POA: Diagnosis not present

## 2016-11-26 DIAGNOSIS — I639 Cerebral infarction, unspecified: Secondary | ICD-10-CM | POA: Diagnosis not present

## 2016-11-26 DIAGNOSIS — J439 Emphysema, unspecified: Secondary | ICD-10-CM | POA: Diagnosis not present

## 2016-11-26 DIAGNOSIS — Z91048 Other nonmedicinal substance allergy status: Secondary | ICD-10-CM | POA: Diagnosis not present

## 2016-11-26 DIAGNOSIS — I959 Hypotension, unspecified: Secondary | ICD-10-CM | POA: Diagnosis not present

## 2016-11-26 DIAGNOSIS — Z888 Allergy status to other drugs, medicaments and biological substances status: Secondary | ICD-10-CM | POA: Diagnosis not present

## 2016-11-26 DIAGNOSIS — N1330 Unspecified hydronephrosis: Secondary | ICD-10-CM | POA: Diagnosis not present

## 2016-11-26 DIAGNOSIS — I6523 Occlusion and stenosis of bilateral carotid arteries: Secondary | ICD-10-CM | POA: Diagnosis not present

## 2016-11-26 DIAGNOSIS — Z87891 Personal history of nicotine dependence: Secondary | ICD-10-CM | POA: Diagnosis not present

## 2016-11-26 DIAGNOSIS — I059 Rheumatic mitral valve disease, unspecified: Secondary | ICD-10-CM | POA: Diagnosis not present

## 2016-11-26 DIAGNOSIS — I519 Heart disease, unspecified: Secondary | ICD-10-CM | POA: Diagnosis not present

## 2016-11-26 DIAGNOSIS — R1031 Right lower quadrant pain: Secondary | ICD-10-CM | POA: Diagnosis not present

## 2016-11-26 DIAGNOSIS — R918 Other nonspecific abnormal finding of lung field: Secondary | ICD-10-CM | POA: Diagnosis not present

## 2016-11-26 DIAGNOSIS — Z79899 Other long term (current) drug therapy: Secondary | ICD-10-CM | POA: Diagnosis not present

## 2016-11-26 DIAGNOSIS — G459 Transient cerebral ischemic attack, unspecified: Secondary | ICD-10-CM | POA: Diagnosis not present

## 2016-11-26 DIAGNOSIS — Z79891 Long term (current) use of opiate analgesic: Secondary | ICD-10-CM | POA: Diagnosis not present

## 2016-11-26 DIAGNOSIS — G629 Polyneuropathy, unspecified: Secondary | ICD-10-CM | POA: Diagnosis not present

## 2016-11-26 DIAGNOSIS — N201 Calculus of ureter: Secondary | ICD-10-CM | POA: Diagnosis not present

## 2016-11-26 DIAGNOSIS — N132 Hydronephrosis with renal and ureteral calculous obstruction: Secondary | ICD-10-CM | POA: Diagnosis not present

## 2016-11-26 DIAGNOSIS — I10 Essential (primary) hypertension: Secondary | ICD-10-CM

## 2016-11-26 DIAGNOSIS — J929 Pleural plaque without asbestos: Secondary | ICD-10-CM | POA: Diagnosis not present

## 2016-11-26 DIAGNOSIS — K219 Gastro-esophageal reflux disease without esophagitis: Secondary | ICD-10-CM | POA: Diagnosis not present

## 2016-11-26 DIAGNOSIS — R938 Abnormal findings on diagnostic imaging of other specified body structures: Secondary | ICD-10-CM | POA: Diagnosis not present

## 2016-11-26 DIAGNOSIS — I2510 Atherosclerotic heart disease of native coronary artery without angina pectoris: Secondary | ICD-10-CM | POA: Diagnosis not present

## 2016-11-26 DIAGNOSIS — N139 Obstructive and reflux uropathy, unspecified: Secondary | ICD-10-CM | POA: Diagnosis not present

## 2016-11-26 DIAGNOSIS — R0602 Shortness of breath: Secondary | ICD-10-CM | POA: Diagnosis not present

## 2016-11-26 DIAGNOSIS — N179 Acute kidney failure, unspecified: Secondary | ICD-10-CM | POA: Diagnosis not present

## 2016-11-26 HISTORY — DX: Cerebral infarction, unspecified: I63.9

## 2016-11-26 NOTE — Progress Notes (Signed)
Subjective:    Patient ID: Jose Hardin, male    DOB: 1930-06-09, 82 y.o.   MRN: [id number]  HPI 82 year old gentleman who presents with some dizziness but really more than dizziness for the past few days he has had some abdominal and back pain. He took tramadol for pain and has not eaten or drunk much. Initial blood pressure here was lower than we have obtained the last few visits and when we did orthostatic pressures systolic pressure dropped from 113-83 and pulse went up from 1 07/31/2019. He is not having any abdominal or back pain at the present time. There was a past history of kidney stones and this pain, by history, reminds him.  Patient Active Problem List   Diagnosis Date Noted  . Carotid bruit present   . Hypertension   . BPH (benign prostatic hyperplasia)   . Colovesical fistula   . Vertebral compression fracture ([place])   . Hyperlipidemia   . Osteoporosis   . Heart murmur   . GERD (gastroesophageal reflux disease)   . Right carotid bruit 05/07/2013  . HTN (hypertension) 05/07/2013  . HLD (hyperlipidemia) 05/07/2013  . Insomnia 05/07/2013  . Prediabetes 05/07/2013  . Back pain 05/07/2013  . Paresthesia of bilateral legs 05/07/2013   Outpatient Encounter Prescriptions as of 11/26/2016  Medication Sig  . alendronate (FOSAMAX) 70 MG tablet TAKE 1 TABLET BY MOUTH ONCE A WEEK AS DIRECTED.  Marland Kitchen amLODipine (NORVASC) 5 MG tablet TAKE 1 TABLET ONCE DAILY.  Marland Kitchen aspirin 325 MG tablet Take 325 mg by mouth daily.  Marland Kitchen atorvastatin (LIPITOR) 40 MG tablet TAKE 1 TABLET ONCE DAILY.  . benazepril (LOTENSIN) 10 MG tablet TAKE 1 TABLET ONCE DAILY.  . cholecalciferol (VITAMIN D) 1000 UNITS tablet Take 1,000 Units by mouth daily.  . meclizine (ANTIVERT) 25 MG tablet Take 1 tablet (25 mg total) by mouth 3 (three) times daily as needed for dizziness.  . Multiple Vitamin (MULTI-VITAMIN DAILY PO) Take 1 tablet by mouth daily.  Marland Kitchen omeprazole (PRILOSEC) 20 MG capsule TAKE (1) CAPSULE DAILY.  .  traMADol (ULTRAM) 50 MG tablet Take one tablet twice a day  . zolpidem (AMBIEN) 10 MG tablet Take 10 mg by mouth at bedtime as needed.   . [DISCONTINUED] fluticasone (FLONASE) 50 MCG/ACT nasal spray Place 1 spray into both nostrils daily.  . [DISCONTINUED] gabapentin (NEURONTIN) 300 MG capsule Take 1-2 caps QHS as directed.   No facility-administered encounter medications on file as of 11/26/2016.        Review of Systems  Constitutional: Positive for activity change.  HENT: Negative.   Respiratory: Negative.   Cardiovascular: Negative.   Gastrointestinal: Positive for abdominal pain.  Neurological: Positive for dizziness.       Objective:   Physical Exam  Constitutional: He is oriented to person, place, and time. He appears well-developed and well-nourished.  HENT:  Mouth/Throat: Oropharynx is clear and moist.  Cardiovascular: Regular rhythm and normal heart sounds.  Exam reveals no gallop.   Pulmonary/Chest: Effort normal and breath sounds normal.  Abdominal: Soft. There is no tenderness.  Neurological: He is alert and oriented to person, place, and time.   BP 103/63   Pulse (!) 108   Temp (!) 96.7 F (35.9 C) (Oral)   Ht 5' 9"  (1.753 m)   Wt 159 lb (72.1 kg)   BMI 23.48 kg/m         Assessment & Plan:  1. Weakness Believe patient to be clinically dehydrated. He has significant  orthostatic changes and I think probably need some IV fluids and further evaluation. Will send to emergency room at [facility]. I spoke with staff there to alert theim of his arrival - Urinalysis, Complete - BMP8+EGFR - CBC with Differential/Platelet  2. Essential hypertension While he has been hypertensive in the past his problem now is hypotension probably related to fluid deficit  Wardell Honour MD  3. Acute right-sided low back pain without sciatica Suspect he may have had a kidney stone but etiology is unclear at this point

## 2016-11-27 DIAGNOSIS — G3184 Mild cognitive impairment, so stated: Secondary | ICD-10-CM | POA: Diagnosis not present

## 2016-11-27 DIAGNOSIS — G4700 Insomnia, unspecified: Secondary | ICD-10-CM | POA: Diagnosis not present

## 2016-11-27 DIAGNOSIS — Z9049 Acquired absence of other specified parts of digestive tract: Secondary | ICD-10-CM | POA: Diagnosis not present

## 2016-11-27 DIAGNOSIS — D72829 Elevated white blood cell count, unspecified: Secondary | ICD-10-CM | POA: Diagnosis not present

## 2016-11-27 DIAGNOSIS — I959 Hypotension, unspecified: Secondary | ICD-10-CM | POA: Diagnosis not present

## 2016-11-27 DIAGNOSIS — G629 Polyneuropathy, unspecified: Secondary | ICD-10-CM | POA: Diagnosis not present

## 2016-11-27 DIAGNOSIS — Z7983 Long term (current) use of bisphosphonates: Secondary | ICD-10-CM | POA: Diagnosis not present

## 2016-11-27 DIAGNOSIS — Z7982 Long term (current) use of aspirin: Secondary | ICD-10-CM | POA: Diagnosis not present

## 2016-11-27 DIAGNOSIS — Z79899 Other long term (current) drug therapy: Secondary | ICD-10-CM | POA: Diagnosis not present

## 2016-11-27 DIAGNOSIS — Z888 Allergy status to other drugs, medicaments and biological substances status: Secondary | ICD-10-CM | POA: Diagnosis not present

## 2016-11-27 DIAGNOSIS — R918 Other nonspecific abnormal finding of lung field: Secondary | ICD-10-CM | POA: Diagnosis not present

## 2016-11-27 DIAGNOSIS — N139 Obstructive and reflux uropathy, unspecified: Secondary | ICD-10-CM | POA: Diagnosis not present

## 2016-11-27 DIAGNOSIS — Z87891 Personal history of nicotine dependence: Secondary | ICD-10-CM | POA: Diagnosis not present

## 2016-11-27 DIAGNOSIS — K219 Gastro-esophageal reflux disease without esophagitis: Secondary | ICD-10-CM | POA: Diagnosis not present

## 2016-11-27 DIAGNOSIS — N132 Hydronephrosis with renal and ureteral calculous obstruction: Secondary | ICD-10-CM | POA: Diagnosis not present

## 2016-11-27 DIAGNOSIS — Z79891 Long term (current) use of opiate analgesic: Secondary | ICD-10-CM | POA: Diagnosis not present

## 2016-11-27 DIAGNOSIS — R0601 Orthopnea: Secondary | ICD-10-CM | POA: Diagnosis not present

## 2016-11-27 DIAGNOSIS — Z23 Encounter for immunization: Secondary | ICD-10-CM | POA: Diagnosis not present

## 2016-11-27 DIAGNOSIS — N179 Acute kidney failure, unspecified: Secondary | ICD-10-CM | POA: Diagnosis not present

## 2016-11-27 DIAGNOSIS — Z91048 Other nonmedicinal substance allergy status: Secondary | ICD-10-CM | POA: Diagnosis not present

## 2016-11-27 DIAGNOSIS — G459 Transient cerebral ischemic attack, unspecified: Secondary | ICD-10-CM | POA: Diagnosis not present

## 2016-12-05 NOTE — Progress Notes (Signed)
Subjective:    Patient ID: Jose Hardin, male    DOB: [birth year], 82 y.o.   MRN: [id number]  HPI 82 year old gentleman who was seen on 1128 with what was thought to be a kidney stone. He was sent to [facility] and he did have a kidney stone with a blockage a stent was placed and he subsequently had a mini stroke while in the hospital. He stayed in the hospital about 5 days. Since being home daughter who brings him today is concerned about his memory. That is an ongoing problem but seems to be a little worse. He is not having any pain area we discovered on a course of his evaluation that pressure is not a lot lower than the right arm than the left psoas circulation in that arm.    Patient Active Problem List   Diagnosis Date Noted  . Carotid bruit present   . Hypertension   . BPH (benign prostatic hyperplasia)   . Colovesical fistula   . Vertebral compression fracture ([place])   . Hyperlipidemia   . Osteoporosis   . Heart murmur   . GERD (gastroesophageal reflux disease)   . Right carotid bruit 05/07/2013  . HTN (hypertension) 05/07/2013  . HLD (hyperlipidemia) 05/07/2013  . Insomnia 05/07/2013  . Prediabetes 05/07/2013  . Back pain 05/07/2013  . Paresthesia of bilateral legs 05/07/2013   Outpatient Encounter Prescriptions as of 12/06/2016  Medication Sig  . alendronate (FOSAMAX) 70 MG tablet TAKE 1 TABLET BY MOUTH ONCE A WEEK AS DIRECTED.  Marland Kitchen aspirin 325 MG tablet Take 325 mg by mouth daily.  Marland Kitchen atorvastatin (LIPITOR) 40 MG tablet TAKE 1 TABLET ONCE DAILY.  . cholecalciferol (VITAMIN D) 1000 UNITS tablet Take 1,000 Units by mouth daily.  . meclizine (ANTIVERT) 25 MG tablet Take 1 tablet (25 mg total) by mouth 3 (three) times daily as needed for dizziness.  . Multiple Vitamin (MULTI-VITAMIN DAILY PO) Take 1 tablet by mouth daily.  Marland Kitchen omeprazole (PRILOSEC) 20 MG capsule TAKE (1) CAPSULE DAILY.  . traMADol (ULTRAM) 50 MG tablet Take one tablet twice a day  . zolpidem (AMBIEN) 10  MG tablet Take 10 mg by mouth at bedtime as needed.   . [DISCONTINUED] amLODipine (NORVASC) 5 MG tablet TAKE 1 TABLET ONCE DAILY.  . [DISCONTINUED] benazepril (LOTENSIN) 10 MG tablet TAKE 1 TABLET ONCE DAILY.   No facility-administered encounter medications on file as of 12/06/2016.       Review of Systems  Respiratory: Negative.   Cardiovascular: Negative.   Gastrointestinal: Negative.   Psychiatric/Behavioral: Positive for confusion.       Objective:   Physical Exam  Constitutional: He is oriented to person, place, and time. He appears well-developed and well-nourished.  Cardiovascular: Normal rate.   Murmur heard. Abdominal: Soft.  Neurological: He is alert and oriented to person, place, and time. He displays normal reflexes. No cranial nerve deficit. He exhibits normal muscle tone. Coordination normal.  Brief test of mental status: Patient does not recall what he ate for supper. Cannot count backwards from 7. Cannot spell world backwards.     BP 112/75 (BP Location: Right Arm, Patient Position: Sitting, Cuff Size: Normal)   Pulse 69   Temp 97.1 F (36.2 C) (Oral)   Ht 5\' 9"  (1.753 m)   Wt 160 lb (72.6 kg)   BMI 23.63 kg/m       Assessment & Plan:  1. Essential hypertension I am okay with blood pressure at 153/72 in the  left arm. In the right arm is 112/75 but as noted above pulse in that arm is much weaker and accounts for lower blood pressure. Needs further evaluation regarding mental and cognitive abilities. I shared with daughter some of what I thought about medications and need for him to continue using his brain to forestall dementia.  Wardell Honour MD

## 2016-12-06 ENCOUNTER — Ambulatory Visit (INDEPENDENT_AMBULATORY_CARE_PROVIDER_SITE_OTHER): Payer: Medicare Other | Admitting: Family Medicine

## 2016-12-06 ENCOUNTER — Encounter: Payer: Self-pay | Admitting: Family Medicine

## 2016-12-06 VITALS — BP 112/75 | HR 69 | Temp 97.1°F | Ht 69.0 in | Wt 160.0 lb

## 2016-12-06 DIAGNOSIS — I10 Essential (primary) hypertension: Secondary | ICD-10-CM | POA: Diagnosis not present

## 2016-12-12 DIAGNOSIS — Z01818 Encounter for other preprocedural examination: Secondary | ICD-10-CM | POA: Diagnosis not present

## 2016-12-12 DIAGNOSIS — Z79899 Other long term (current) drug therapy: Secondary | ICD-10-CM | POA: Diagnosis not present

## 2016-12-12 DIAGNOSIS — N201 Calculus of ureter: Secondary | ICD-10-CM | POA: Diagnosis not present

## 2016-12-12 DIAGNOSIS — Z87891 Personal history of nicotine dependence: Secondary | ICD-10-CM | POA: Diagnosis not present

## 2016-12-12 DIAGNOSIS — E785 Hyperlipidemia, unspecified: Secondary | ICD-10-CM | POA: Diagnosis not present

## 2016-12-12 DIAGNOSIS — Z8673 Personal history of transient ischemic attack (TIA), and cerebral infarction without residual deficits: Secondary | ICD-10-CM | POA: Diagnosis not present

## 2016-12-12 DIAGNOSIS — I10 Essential (primary) hypertension: Secondary | ICD-10-CM | POA: Diagnosis not present

## 2016-12-18 DIAGNOSIS — I63311 Cerebral infarction due to thrombosis of right middle cerebral artery: Secondary | ICD-10-CM | POA: Diagnosis not present

## 2016-12-18 DIAGNOSIS — G459 Transient cerebral ischemic attack, unspecified: Secondary | ICD-10-CM | POA: Diagnosis not present

## 2016-12-18 DIAGNOSIS — I10 Essential (primary) hypertension: Secondary | ICD-10-CM | POA: Diagnosis not present

## 2016-12-18 DIAGNOSIS — E784 Other hyperlipidemia: Secondary | ICD-10-CM | POA: Diagnosis not present

## 2016-12-19 ENCOUNTER — Telehealth: Payer: Self-pay | Admitting: *Deleted

## 2016-12-19 DIAGNOSIS — Z7982 Long term (current) use of aspirin: Secondary | ICD-10-CM | POA: Diagnosis not present

## 2016-12-19 DIAGNOSIS — Z79899 Other long term (current) drug therapy: Secondary | ICD-10-CM | POA: Diagnosis not present

## 2016-12-19 DIAGNOSIS — Z888 Allergy status to other drugs, medicaments and biological substances status: Secondary | ICD-10-CM | POA: Diagnosis not present

## 2016-12-19 DIAGNOSIS — I10 Essential (primary) hypertension: Secondary | ICD-10-CM | POA: Diagnosis not present

## 2016-12-19 DIAGNOSIS — Z87891 Personal history of nicotine dependence: Secondary | ICD-10-CM | POA: Diagnosis not present

## 2016-12-19 DIAGNOSIS — N400 Enlarged prostate without lower urinary tract symptoms: Secondary | ICD-10-CM | POA: Diagnosis not present

## 2016-12-19 DIAGNOSIS — K219 Gastro-esophageal reflux disease without esophagitis: Secondary | ICD-10-CM | POA: Diagnosis not present

## 2016-12-19 DIAGNOSIS — N201 Calculus of ureter: Secondary | ICD-10-CM | POA: Diagnosis not present

## 2016-12-19 DIAGNOSIS — E785 Hyperlipidemia, unspecified: Secondary | ICD-10-CM | POA: Diagnosis not present

## 2016-12-19 DIAGNOSIS — R011 Cardiac murmur, unspecified: Secondary | ICD-10-CM | POA: Diagnosis not present

## 2016-12-19 DIAGNOSIS — M1990 Unspecified osteoarthritis, unspecified site: Secondary | ICD-10-CM | POA: Diagnosis not present

## 2016-12-19 MED ORDER — TRAMADOL HCL 50 MG PO TABS: ORAL_TABLET | ORAL | 0 refills | Status: DC

## 2016-12-19 MED ORDER — MECLIZINE HCL 25 MG PO TABS: 25.0000 mg | ORAL_TABLET | Freq: Three times a day (TID) | ORAL | 0 refills | Status: DC | PRN

## 2016-12-19 NOTE — Telephone Encounter (Signed)
Okay to refill these medicines for Jose Hardin

## 2016-12-19 NOTE — Telephone Encounter (Signed)
Medications called into [facility] per Dr Sabra Heck Pt notified

## 2016-12-19 NOTE — Telephone Encounter (Signed)
Pt had stent placement and kidney stone removal today Needs refill on Meclizine and Tramadol Please review and advise

## 2016-12-25 DIAGNOSIS — N200 Calculus of kidney: Secondary | ICD-10-CM | POA: Diagnosis not present

## 2016-12-26 ENCOUNTER — Other Ambulatory Visit: Payer: Self-pay | Admitting: Family Medicine

## 2016-12-31 DIAGNOSIS — N201 Calculus of ureter: Secondary | ICD-10-CM | POA: Diagnosis not present

## 2017-02-27 ENCOUNTER — Ambulatory Visit: Payer: Medicare Other | Admitting: Family Medicine

## 2017-03-04 DIAGNOSIS — G459 Transient cerebral ischemic attack, unspecified: Secondary | ICD-10-CM | POA: Diagnosis not present

## 2017-03-04 DIAGNOSIS — G3184 Mild cognitive impairment, so stated: Secondary | ICD-10-CM | POA: Diagnosis not present

## 2017-03-04 DIAGNOSIS — I10 Essential (primary) hypertension: Secondary | ICD-10-CM | POA: Diagnosis not present

## 2017-04-01 DIAGNOSIS — G3184 Mild cognitive impairment, so stated: Secondary | ICD-10-CM | POA: Diagnosis not present

## 2017-04-01 DIAGNOSIS — I10 Essential (primary) hypertension: Secondary | ICD-10-CM | POA: Diagnosis not present

## 2017-06-03 DIAGNOSIS — G3184 Mild cognitive impairment, so stated: Secondary | ICD-10-CM | POA: Diagnosis not present

## 2017-06-03 DIAGNOSIS — I10 Essential (primary) hypertension: Secondary | ICD-10-CM | POA: Diagnosis not present

## 2017-06-17 ENCOUNTER — Other Ambulatory Visit: Payer: Self-pay | Admitting: Family Medicine

## 2017-07-09 ENCOUNTER — Other Ambulatory Visit: Payer: Self-pay | Admitting: Family Medicine

## 2017-07-10 MED ORDER — OMEPRAZOLE 20 MG PO CPDR: DELAYED_RELEASE_CAPSULE | ORAL | 0 refills | Status: DC

## 2017-07-10 NOTE — Telephone Encounter (Signed)
Done, called daughter

## 2017-08-06 ENCOUNTER — Ambulatory Visit (INDEPENDENT_AMBULATORY_CARE_PROVIDER_SITE_OTHER): Payer: Medicare Other | Admitting: Family Medicine

## 2017-08-06 ENCOUNTER — Encounter: Payer: Self-pay | Admitting: Family Medicine

## 2017-08-06 VITALS — BP 113/82 | HR 54 | Temp 97.0°F | Ht 69.0 in | Wt 163.2 lb

## 2017-08-06 DIAGNOSIS — E785 Hyperlipidemia, unspecified: Secondary | ICD-10-CM | POA: Diagnosis not present

## 2017-08-06 DIAGNOSIS — M810 Age-related osteoporosis without current pathological fracture: Secondary | ICD-10-CM | POA: Diagnosis not present

## 2017-08-06 DIAGNOSIS — G3184 Mild cognitive impairment, so stated: Secondary | ICD-10-CM

## 2017-08-06 DIAGNOSIS — Z8673 Personal history of transient ischemic attack (TIA), and cerebral infarction without residual deficits: Secondary | ICD-10-CM | POA: Insufficient documentation

## 2017-08-06 MED ORDER — ALENDRONATE SODIUM 70 MG PO TABS: ORAL_TABLET | ORAL | 3 refills | Status: DC

## 2017-08-06 NOTE — Patient Instructions (Signed)
Great to meet you!  Come back in 6 months unless you need us sooner.    

## 2017-08-06 NOTE — Progress Notes (Signed)
   HPI  Patient presents today to establish care, his previous PCP has retired.  Also to review chronic medical conditions. History of vertebral compression fracture- his DEXA scan T scores indicate osteopenia, he had a traumatic compression fracture in the 1960s, he had a new versus old fracture found in 2014. Currently no pain.  Patient has had a recent history of TA, he's been diagnosed with mild cognitive impairment by neurology and has been started on Aricept. They would like to avoid any sedating medications. He's also had a recent history of kidney stone.  Hyperlipidemia They would like to discontinue and start Lipitor if possible. He has not been taking it for about a year.   PMH: Smoking status noted ROS: Per HPI  Objective: BP 113/82   Pulse (!) 54   Temp (!) 97 F (36.1 C) (Oral)   Ht '5\' 9"'$  (1.753 m)   Wt 163 lb 3.2 oz (74 kg)   BMI 24.10 kg/m  Gen: NAD, alert, cooperative with exam HEENT: NCAT, EOMI, PERRL CV: RRR, good S1/S2, no murmur Resp: CTABL, no wheezes, non-labored Abd: SNTND, BS present, no guarding or organomegaly Ext: No edema, warm Neuro: Alert and oriented, No gross deficits  Assessment and plan:  # History of TIA Patient had TIA in late 2017, he had quick resumption of normal function and MRI found an old ischemic infarct. Patient and his family described that symptoms were due to transient loss of blood pressure. LDL  # Osteoporosis, compression fracture Checking vitamin D I will proceed with treating him with Fosamax based on possible new compression fracture in 2014 although his T score is very reasonable. Vitamin D checked as well  # Hyperlipidemia LDL goal less than 70 given history of stroke Patient would like to avoid statins  # Mild cognitive impairment Following with neurology, taking Aricept without problem currently     Orders Placed This Encounter  Procedures  . CMP14+EGFR  . CBC with Differential/Platelet  . Lipid  panel  . VITAMIN D 25 Hydroxy (Vit-D Deficiency, Fractures)    Meds ordered this encounter  Medications  . donepezil (ARICEPT) 10 MG tablet    Sig: Take 5 mg by mouth 2 (two) times daily.  Marland Kitchen alendronate (FOSAMAX) 70 MG tablet    Sig: TAKE 1 TABLET BY MOUTH ONCE A WEEK AS DIRECTED.    Dispense:  4 tablet    Refill:  [place], MD [place] 08/06/2017, 12:20 PM

## 2017-08-07 LAB — LIPID PANEL
CHOL/HDL RATIO: 5.7 ratio — AB (ref 0.0–5.0)
Cholesterol, Total: 212 mg/dL — ABNORMAL HIGH (ref 100–199)
HDL: 37 mg/dL — ABNORMAL LOW (ref >39)
LDL Calculated: 142 mg/dL — ABNORMAL HIGH (ref 0–99)
TRIGLYCERIDES: 165 mg/dL — AB (ref 0–149)
VLDL Cholesterol Cal: 33 mg/dL (ref 5–40)

## 2017-08-07 LAB — VITAMIN D 25 HYDROXY (VIT D DEFICIENCY, FRACTURES): Vit D, 25-Hydroxy: 32.6 ng/mL (ref 30.0–100.0)

## 2017-08-07 LAB — CBC WITH DIFFERENTIAL/PLATELET
Basophils Absolute: 0.1 10*3/uL (ref 0.0–0.2)
Basos: 1 %
EOS (ABSOLUTE): 0.2 10*3/uL (ref 0.0–0.4)
EOS: 3 %
HEMATOCRIT: 41.5 % (ref 37.5–51.0)
Hemoglobin: 13.9 g/dL (ref 13.0–17.7)
Immature Grans (Abs): 0 10*3/uL (ref 0.0–0.1)
Immature Granulocytes: 0 %
Lymphocytes Absolute: 1.5 10*3/uL (ref 0.7–3.1)
Lymphs: 25 %
MCH: 27.5 pg (ref 26.6–33.0)
MCHC: 33.5 g/dL (ref 31.5–35.7)
MCV: 82 fL (ref 79–97)
MONOS ABS: 0.5 10*3/uL (ref 0.1–0.9)
Monocytes: 8 %
Neutrophils Absolute: 3.9 10*3/uL (ref 1.4–7.0)
Neutrophils: 63 %
PLATELETS: 230 10*3/uL (ref 150–379)
RBC: 5.05 x10E6/uL (ref 4.14–5.80)
RDW: 14.7 % (ref 12.3–15.4)
WBC: 6.2 10*3/uL (ref 3.4–10.8)

## 2017-08-07 LAB — CMP14+EGFR
ALBUMIN: 4.3 g/dL (ref 3.5–4.7)
ALK PHOS: 92 IU/L (ref 39–117)
ALT: 11 IU/L (ref 0–44)
AST: 24 IU/L (ref 0–40)
Albumin/Globulin Ratio: 1.9 (ref 1.2–2.2)
BILIRUBIN TOTAL: 0.5 mg/dL (ref 0.0–1.2)
BUN / CREAT RATIO: 9 — AB (ref 10–24)
BUN: 13 mg/dL (ref 8–27)
CHLORIDE: 101 mmol/L (ref 96–106)
CO2: 25 mmol/L (ref 20–29)
Calcium: 9.3 mg/dL (ref 8.6–10.2)
Creatinine, Ser: 1.37 mg/dL — ABNORMAL HIGH (ref 0.76–1.27)
GFR calc Af Amer: 54 mL/min/{1.73_m2} — ABNORMAL LOW (ref >59)
GFR calc non Af Amer: 47 mL/min/{1.73_m2} — ABNORMAL LOW (ref >59)
Globulin, Total: 2.3 g/dL (ref 1.5–4.5)
Glucose: 107 mg/dL — ABNORMAL HIGH (ref 65–99)
POTASSIUM: 4.6 mmol/L (ref 3.5–5.2)
SODIUM: 139 mmol/L (ref 134–144)
Total Protein: 6.6 g/dL (ref 6.0–8.5)

## 2017-08-07 MED ORDER — ATORVASTATIN CALCIUM 20 MG PO TABS: 20.0000 mg | ORAL_TABLET | Freq: Every day | ORAL | 3 refills | Status: DC

## 2017-08-07 NOTE — Addendum Note (Signed)
Addended by: Karle Plumber on: 08/07/2017 09:31 AM   Modules accepted: Orders

## 2017-08-21 ENCOUNTER — Other Ambulatory Visit: Payer: Self-pay | Admitting: Family

## 2017-09-23 DIAGNOSIS — R252 Cramp and spasm: Secondary | ICD-10-CM | POA: Diagnosis not present

## 2017-09-23 DIAGNOSIS — R202 Paresthesia of skin: Secondary | ICD-10-CM | POA: Diagnosis not present

## 2017-10-01 DIAGNOSIS — G309 Alzheimer's disease, unspecified: Secondary | ICD-10-CM | POA: Diagnosis not present

## 2017-10-01 DIAGNOSIS — I10 Essential (primary) hypertension: Secondary | ICD-10-CM | POA: Diagnosis not present

## 2017-11-21 ENCOUNTER — Other Ambulatory Visit: Payer: Self-pay | Admitting: Family

## 2018-02-10 ENCOUNTER — Ambulatory Visit: Payer: Medicare Other | Admitting: Family Medicine

## 2018-02-16 ENCOUNTER — Ambulatory Visit: Payer: Medicare Other | Admitting: Family Medicine

## 2018-02-18 ENCOUNTER — Ambulatory Visit (INDEPENDENT_AMBULATORY_CARE_PROVIDER_SITE_OTHER): Payer: Medicare Other | Admitting: Family Medicine

## 2018-02-18 ENCOUNTER — Ambulatory Visit (INDEPENDENT_AMBULATORY_CARE_PROVIDER_SITE_OTHER): Payer: Medicare Other

## 2018-02-18 ENCOUNTER — Encounter: Payer: Self-pay | Admitting: Family Medicine

## 2018-02-18 VITALS — BP 164/82 | HR 88 | Temp 97.0°F | Ht 69.0 in | Wt 166.0 lb

## 2018-02-18 DIAGNOSIS — E785 Hyperlipidemia, unspecified: Secondary | ICD-10-CM | POA: Diagnosis not present

## 2018-02-18 DIAGNOSIS — G3184 Mild cognitive impairment, so stated: Secondary | ICD-10-CM

## 2018-02-18 DIAGNOSIS — N400 Enlarged prostate without lower urinary tract symptoms: Secondary | ICD-10-CM | POA: Diagnosis not present

## 2018-02-18 DIAGNOSIS — R0989 Other specified symptoms and signs involving the circulatory and respiratory systems: Secondary | ICD-10-CM

## 2018-02-18 DIAGNOSIS — G459 Transient cerebral ischemic attack, unspecified: Secondary | ICD-10-CM | POA: Diagnosis not present

## 2018-02-18 DIAGNOSIS — I10 Essential (primary) hypertension: Secondary | ICD-10-CM | POA: Diagnosis not present

## 2018-02-18 DIAGNOSIS — M4850XA Collapsed vertebra, not elsewhere classified, site unspecified, initial encounter for fracture: Secondary | ICD-10-CM

## 2018-02-18 DIAGNOSIS — R7303 Prediabetes: Secondary | ICD-10-CM | POA: Diagnosis not present

## 2018-02-18 DIAGNOSIS — M810 Age-related osteoporosis without current pathological fracture: Secondary | ICD-10-CM

## 2018-02-18 DIAGNOSIS — M545 Low back pain, unspecified: Secondary | ICD-10-CM

## 2018-02-18 LAB — BAYER DCA HB A1C WAIVED: HB A1C: 6 % (ref <7.0)

## 2018-02-18 MED ORDER — PANTOPRAZOLE SODIUM 40 MG PO TBEC: 40.0000 mg | DELAYED_RELEASE_TABLET | Freq: Every day | ORAL | 5 refills | Status: DC

## 2018-02-18 NOTE — Progress Notes (Signed)
Subjective:  Patient ID: Jose Hardin, male    DOB: [birth year]  Age: 84 y.o. MRN: [id number]  CC: Follow-up (pt here today for routine follow up of his chronic medical conditions )   HPI Jose Hardin presents for concern for TIAs.  He has had them in the past and recently was found to have a bruit by a nurse practitioner from an insurance company that visited him at his home.  Follow-up of his Alzheimer's disease for which he is currently taking Aricept 5 mg twice daily.  He is actually been doing quite well with that.  In evaluation from home health was brought in showing that he drew a clock showing 7:00 and it looks like a perfect score.  He could not tolerate Namenda due to increased somnolence.  Patient also apparently in the past had a vertebral fracture.  He does not recall the circumstances but his daughters who are with him tell me that he had an injury in the remote past that caused traumatic spinal compression.  They do not know of a reason why he is taking osteoporosis medicine and wonder why he needs to take Fosamax.  Review of bone density scan done 5 years ago shows that he had a -1.8 T score at the head of the femur at the time he was placed on Fosamax.  Dr. Wendi Snipes who had seen him previously also had renewed his Fosamax based on this report.  See his note of 08/06/2017.  Noted at that time was he and his daughters want to discontinue Lipitor.  Apparently he has not taken it for a year and a half or more.  He and his daughters both agree that he should be looking at quality of life treatments as opposed to quantity of life and feel that Lipitor adds very little at his advanced age.  However, his blood pressure is elevated today.  He has not historically had high blood pressure.  He is not treated for it at this time.  Both daughters expressed significant surprise at the elevation noted on multiple checks today.  In addition he is having some discomfort in his ears that they would like  checked.  He feels that his hearing is diminished. Depression screen [facility] 2/9 08/06/2017 12/06/2016 11/26/2016  Decreased Interest 0 0 0  Down, Depressed, Hopeless 0 0 0  PHQ - 2 Score 0 0 0    History Jose Hardin has a past medical history of BPH (benign prostatic hyperplasia), Carotid bruit present, Colovesical fistula, GERD (gastroesophageal reflux disease), Heart murmur, Hyperlipidemia, Hypertension, Kidney stones, Osteoporosis, Prediabetes, Stroke ([place]) (11/26/2016), and Vertebral compression fracture ([place]).   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   His family history is not on file.He reports that he quit smoking about 36 years ago. he has never used smokeless tobacco. He reports that he does not drink alcohol or use drugs.    ROS Review of Systems  Constitutional: Negative for chills, diaphoresis, fever and unexpected weight change.  HENT: Positive for ear pain and hearing loss. Negative for congestion, rhinorrhea and sore throat.   Eyes: Negative for visual disturbance.  Respiratory: Negative for cough and shortness of breath.   Cardiovascular: Negative for chest pain.  Gastrointestinal: Negative for abdominal pain, constipation and diarrhea.  Genitourinary: Negative for dysuria and flank pain.  Musculoskeletal: Negative for arthralgias and joint swelling.  Skin: Negative for rash.  Neurological: Negative for dizziness and headaches.  Psychiatric/Behavioral: Positive for confusion and decreased concentration. Negative for dysphoric mood and sleep disturbance.    Objective:  BP (!) 164/82   Pulse 88   Temp (!) 97 F (36.1 C) (Oral)   Ht 5' 9"  (1.753 m)   Wt 166 lb (75.3 kg)   BMI 24.51 kg/m   BP Readings from Last 3 Encounters:  02/18/18 (!) 164/82  08/06/17 113/82  12/06/16 112/75    Wt Readings from Last 3 Encounters:  02/18/18 166 lb  (75.3 kg)  08/06/17 163 lb 3.2 oz (74 kg)  12/06/16 160 lb (72.6 kg)     Physical Exam  Constitutional: He is oriented to person, place, and time. He appears well-developed and well-nourished.  HENT:  Head: Normocephalic and atraumatic.  Mouth/Throat: Oropharynx is clear and moist.  Eyes: EOM are normal. Pupils are equal, round, and reactive to light.  Neck: Trachea normal, normal range of motion and phonation normal. No hepatojugular reflux present. Carotid bruit is present (Limited to the right carotid at the bifurcation). No tracheal deviation present. No Brudzinski's sign and no Kernig's sign noted. No thyroid mass and no thyromegaly present.  Cardiovascular: Normal rate, regular rhythm and normal heart sounds. Exam reveals no gallop and no friction rub.  No murmur heard. Pulmonary/Chest: Breath sounds normal. He has no wheezes. He has no rales.  Abdominal: Soft. He exhibits no mass. There is no tenderness.  Musculoskeletal: Normal range of motion. He exhibits no edema.  Lymphadenopathy:    He has no cervical adenopathy.  Neurological: He is alert and oriented to person, place, and time.  Skin: Skin is warm and dry.  Psychiatric: He has a normal mood and affect. His speech is normal and behavior is normal. Thought content normal. Cognition and memory are impaired.  Patient did well on an abbreviated sampling of the MMSE.  Particularly he did note of the day and date but not the month.  He knows the type of building he is in town in the county he is in as well as the fact that he lives in [place].  He remembers 3 objects and can repeat them back several minutes later.  Full MMSE was not performed due to the normal clock exam.   Results for orders placed or performed in visit on 02/18/18  Bayer DCA Hb A1c Waived  Result Value Ref Range   Bayer DCA Hb A1c Waived 6.0 <7.0 %      Assessment & Plan:   Jose Hardin was seen today for follow-up.  Diagnoses and all orders for this  visit:  Accelerated hypertension -     CBC with Differential/Platelet -     CMP14+EGFR  TIA (transient ischemic attack)  Right carotid bruit -     US Carotid Duplex Bilateral; Future  Hyperlipidemia, unspecified hyperlipidemia type -     Lipid panel  Osteoporosis, unspecified osteoporosis type, unspecified pathological fracture presence -     DG [facility] DEXA  Prediabetes -     Bayer DCA Hb A1c Waived -     Urinalysis  Benign prostatic hyperplasia, unspecified whether lower urinary tract symptoms present -     PSA, total and free  Mild cognitive impairment  Acute right-sided low back pain without sciatica  Vertebral compression fracture (HCC)  Other orders -     pantoprazole (PROTONIX) 40 MG tablet; Take 1 tablet (40 mg total) by mouth daily.    Patient is  noted to have multiple risk factors for TIA including the elevated blood pressure which actually may be secondary to a previous ischemic event.  Additionally his elevated cholesterol has not been recently treated and he has been diagnosed with prediabetes that is also not being treated.  A1c today was found to be 6.0.  Family prefers no treatment for condition at this level.  Based on these risk factors treatment seems indicated for cholesterol but should his carotid bruit turn out to be not hemodynamically significant aspirin therapy alone may be reasonable.  The osteopenia has been treated for several years.  At his advanced age again the daughters are concerned that this medicine causes significant side effects and they do not know that they want him treated.  They would like to have a DEXA performed.   I have discontinued Nikki Dom. Sabic's atorvastatin, magnesium oxide, atorvastatin, and omeprazole. I am also having him start on pantoprazole. Additionally, I am having him maintain his aspirin, meclizine, traMADol, donepezil, alendronate, vitamin B-12, and MAGNESIUM MALATE PO.  Allergies as of 02/18/2018      Reactions    Namenda [memantine Hcl] Other (See Comments)   Somnolence   Pregabalin Other (See Comments)   Soap Rash      Medication List        Accurate as of 02/18/18  6:43 PM. Always use your most recent med list.          alendronate 70 MG tablet Commonly known as:  FOSAMAX TAKE 1 TABLET BY MOUTH ONCE A WEEK AS DIRECTED.   aspirin 325 MG tablet Take 325 mg by mouth daily.   donepezil 10 MG tablet Commonly known as:  ARICEPT Take 5 mg by mouth 2 (two) times daily.   MAGNESIUM MALATE PO Take 1,350 mg by mouth 2 (two) times daily.   meclizine 25 MG tablet Commonly known as:  ANTIVERT Take 1 tablet (25 mg total) by mouth 3 (three) times daily as needed for dizziness.   pantoprazole 40 MG tablet Commonly known as:  PROTONIX Take 1 tablet (40 mg total) by mouth daily.   traMADol 50 MG tablet Commonly known as:  ULTRAM Take one tablet twice a day   vitamin B-12 1000 MCG tablet Commonly known as:  CYANOCOBALAMIN Take 1,000 mcg by mouth daily.       Follow-up: Return in about 4 weeks (around 03/18/2018) for hypertension.  Claretta Fraise, M.D.

## 2018-02-19 DIAGNOSIS — M85851 Other specified disorders of bone density and structure, right thigh: Secondary | ICD-10-CM | POA: Diagnosis not present

## 2018-02-19 LAB — LIPID PANEL
CHOL/HDL RATIO: 6.8 ratio — AB (ref 0.0–5.0)
CHOLESTEROL TOTAL: 223 mg/dL — AB (ref 100–199)
HDL: 33 mg/dL — ABNORMAL LOW (ref >39)
LDL CALC: 144 mg/dL — AB (ref 0–99)
Triglycerides: 230 mg/dL — ABNORMAL HIGH (ref 0–149)
VLDL CHOLESTEROL CAL: 46 mg/dL — AB (ref 5–40)

## 2018-02-19 LAB — CMP14+EGFR
ALBUMIN: 4.2 g/dL (ref 3.5–4.7)
ALK PHOS: 91 IU/L (ref 39–117)
ALT: 11 IU/L (ref 0–44)
AST: 23 IU/L (ref 0–40)
Albumin/Globulin Ratio: 1.5 (ref 1.2–2.2)
BUN / CREAT RATIO: 16 (ref 10–24)
BUN: 19 mg/dL (ref 8–27)
Bilirubin Total: 0.4 mg/dL (ref 0.0–1.2)
CO2: 22 mmol/L (ref 20–29)
CREATININE: 1.22 mg/dL (ref 0.76–1.27)
Calcium: 9.2 mg/dL (ref 8.6–10.2)
Chloride: 101 mmol/L (ref 96–106)
GFR, EST AFRICAN AMERICAN: 62 mL/min/{1.73_m2} (ref >59)
GFR, EST NON AFRICAN AMERICAN: 54 mL/min/{1.73_m2} — AB (ref >59)
GLOBULIN, TOTAL: 2.8 g/dL (ref 1.5–4.5)
GLUCOSE: 122 mg/dL — AB (ref 65–99)
Potassium: 4.8 mmol/L (ref 3.5–5.2)
SODIUM: 140 mmol/L (ref 134–144)
TOTAL PROTEIN: 7 g/dL (ref 6.0–8.5)

## 2018-02-19 LAB — CBC WITH DIFFERENTIAL/PLATELET
Basophils Absolute: 0.1 10*3/uL (ref 0.0–0.2)
Basos: 1 %
EOS (ABSOLUTE): 0.2 10*3/uL (ref 0.0–0.4)
Eos: 3 %
HEMOGLOBIN: 14.4 g/dL (ref 13.0–17.7)
Hematocrit: 42.9 % (ref 37.5–51.0)
IMMATURE GRANS (ABS): 0 10*3/uL (ref 0.0–0.1)
Immature Granulocytes: 0 %
LYMPHS ABS: 1.5 10*3/uL (ref 0.7–3.1)
LYMPHS: 23 %
MCH: 27.1 pg (ref 26.6–33.0)
MCHC: 33.6 g/dL (ref 31.5–35.7)
MCV: 81 fL (ref 79–97)
MONOCYTES: 8 %
Monocytes Absolute: 0.5 10*3/uL (ref 0.1–0.9)
NEUTROS ABS: 4 10*3/uL (ref 1.4–7.0)
Neutrophils: 65 %
Platelets: 238 10*3/uL (ref 150–379)
RBC: 5.31 x10E6/uL (ref 4.14–5.80)
RDW: 14.5 % (ref 12.3–15.4)
WBC: 6.2 10*3/uL (ref 3.4–10.8)

## 2018-02-19 LAB — PSA, TOTAL AND FREE
PROSTATE SPECIFIC AG, SERUM: 0.6 ng/mL (ref 0.0–4.0)
PSA FREE PCT: 48.3 %
PSA FREE: 0.29 ng/mL

## 2018-02-20 ENCOUNTER — Other Ambulatory Visit: Payer: Self-pay | Admitting: Family Medicine

## 2018-02-20 DIAGNOSIS — I6523 Occlusion and stenosis of bilateral carotid arteries: Secondary | ICD-10-CM | POA: Diagnosis not present

## 2018-02-20 DIAGNOSIS — R0989 Other specified symptoms and signs involving the circulatory and respiratory systems: Secondary | ICD-10-CM | POA: Diagnosis not present

## 2018-03-16 ENCOUNTER — Ambulatory Visit: Payer: Medicare Other | Admitting: Family Medicine

## 2018-03-18 ENCOUNTER — Ambulatory Visit (INDEPENDENT_AMBULATORY_CARE_PROVIDER_SITE_OTHER): Payer: Medicare Other | Admitting: Family Medicine

## 2018-03-18 ENCOUNTER — Encounter: Payer: Self-pay | Admitting: Family Medicine

## 2018-03-18 ENCOUNTER — Ambulatory Visit (INDEPENDENT_AMBULATORY_CARE_PROVIDER_SITE_OTHER): Payer: Medicare Other

## 2018-03-18 VITALS — BP 117/80 | HR 59 | Temp 97.5°F | Ht 69.0 in | Wt 165.1 lb

## 2018-03-18 DIAGNOSIS — M79641 Pain in right hand: Secondary | ICD-10-CM

## 2018-03-18 DIAGNOSIS — G3184 Mild cognitive impairment, so stated: Secondary | ICD-10-CM

## 2018-03-18 DIAGNOSIS — I10 Essential (primary) hypertension: Secondary | ICD-10-CM

## 2018-03-18 NOTE — Progress Notes (Signed)
Subjective:  Patient ID: Jose Hardin, male    DOB: 11-Apr-1930  Age: 84 y.o. MRN: [id number]  CC: Hypertension   HPI STANDLEY BARGO presents for  follow-up of hypertension. Patient has no history of headache chest pain or shortness of breath or recent cough. Patient also denies symptoms of TIA such as focal numbness or weakness. Patient checks  blood pressure at home. Readings recently show systolics in the 937-169 range and diastolics in the 67-89 range.  He brings a log sheet with him showing daily readings over the last 2 weeks.  There is one occasion where the pressure was 196/97.  Patient denies side effects from medication. States taking it regularly.  The monitor that is in use in the home is quite old and the family cannot bouts for its accuracy.  Patient also reports significant pain in the right hand.  He points to the region of the right third MCP knuckle.  It is moderate in severity.  It is intermittent in occurrence.  It has been present for several months.  It minimally limits activity.   History [place] has a past medical history of BPH (benign prostatic hyperplasia), Carotid bruit present, Colovesical fistula, GERD (gastroesophageal reflux disease), Heart murmur, Hyperlipidemia, Hypertension, Kidney stones, Osteoporosis, Prediabetes, Stroke ([place]) (11/26/2016), and Vertebral compression fracture ([place]).   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   His family history is not on file.He reports that he quit smoking about 36 years ago. he has never used smokeless tobacco. He reports that he does not drink alcohol or use drugs.  Current Outpatient Medications on File Prior to Visit  Medication Sig Dispense Refill  . alendronate (FOSAMAX) 70 MG tablet TAKE 1 TABLET ONCE A WEEK AS DIRECTED . 4 tablet 3  . aspirin 325 MG tablet Take 325 mg by mouth daily.      Marland Kitchen donepezil (ARICEPT) 10 MG tablet Take 5 mg by mouth 2 (two) times daily.    Marland Kitchen MAGNESIUM MALATE PO Take 1,350 mg by mouth 2 (two) times daily.    . meclizine (ANTIVERT) 25 MG tablet Take 1 tablet (25 mg total) by mouth 3 (three) times daily as needed for dizziness. 90 tablet 0  . pantoprazole (PROTONIX) 40 MG tablet Take 1 tablet (40 mg total) by mouth daily. 30 tablet 5  . traMADol (ULTRAM) 50 MG tablet Take one tablet twice a day (Patient taking differently: Take 50 mg by mouth as needed. Take one tablet twice a day) 60 tablet 0  . vitamin B-12 (CYANOCOBALAMIN) 1000 MCG tablet Take 1,000 mcg by mouth daily.     No current facility-administered medications on file prior to visit.     ROS Review of Systems  Constitutional: Negative for chills, diaphoresis and fever.  HENT: Negative for rhinorrhea and sore throat.   Respiratory: Negative for cough and shortness of breath.   Cardiovascular: Negative for chest pain.  Gastrointestinal: Negative for abdominal pain.  Musculoskeletal: Positive for arthralgias.  Skin: Negative for rash.  Neurological: Negative for headaches.  Psychiatric/Behavioral: Positive for decreased concentration.       His daughters and wife are with him.  The daughters say that he is somewhat forgetful.    Objective:  BP 117/80 Comment: left arm  Pulse (!) 59   Temp (!) 97.5 F (36.4 C) (Oral)   Ht 5\' 9"  (1.753 m)  Wt 165 lb 2 oz (74.9 kg)   BMI 24.38 kg/m   BP Readings from Last 3 Encounters:  03/18/18 117/80  02/18/18 (!) 164/82  08/06/17 113/82    Wt Readings from Last 3 Encounters:  03/18/18 165 lb 2 oz (74.9 kg)  02/18/18 166 lb (75.3 kg)  08/06/17 163 lb 3.2 oz (74 kg)     Physical Exam  Constitutional: He is oriented to person, place, and time. He appears well-developed. No distress.  HENT:  Head: Normocephalic and atraumatic.  Right Ear: External ear normal.  Left Ear: External ear normal.  Mouth/Throat: No posterior oropharyngeal  erythema.  Eyes: Pupils are equal, round, and reactive to light.  Neck: Normal range of motion. Neck supple.  Cardiovascular: Normal rate and regular rhythm.  No murmur heard. Pulmonary/Chest: Breath sounds normal. No respiratory distress.  Musculoskeletal: Normal range of motion. He exhibits tenderness (Along the extensor tendon at its insertion into the third digit dorsally.  Is tender to palpation along its course to the wrist.  There is some tenderness noted as far as palpation at the tendon to the tendon on muscular junction.).  Neurological: He is alert and oriented to person, place, and time.  Skin: Skin is warm and dry.  Psychiatric: His affect is blunt. He is slowed. Cognition and memory are impaired.  Vitals reviewed.     Assessment & Plan:   Delta was seen today for hypertension.  Diagnoses and all orders for this visit:  Essential hypertension  Hand pain, right -     DG Hand Complete Right; Future -     DME Other see comment  Mild cognitive impairment   Allergies as of 03/18/2018      Reactions   Namenda [memantine Hcl] Other (See Comments)   Somnolence   Pregabalin Other (See Comments)   Soap Rash      Medication List        Accurate as of 03/18/18 10:34 PM. Always use your most recent med list.          alendronate 70 MG tablet Commonly known as:  FOSAMAX TAKE 1 TABLET ONCE A WEEK AS DIRECTED .   aspirin 325 MG tablet Take 325 mg by mouth daily.   donepezil 10 MG tablet Commonly known as:  ARICEPT Take 5 mg by mouth 2 (two) times daily.   MAGNESIUM MALATE PO Take 1,350 mg by mouth 2 (two) times daily.   meclizine 25 MG tablet Commonly known as:  ANTIVERT Take 1 tablet (25 mg total) by mouth 3 (three) times daily as needed for dizziness.   pantoprazole 40 MG tablet Commonly known as:  PROTONIX Take 1 tablet (40 mg total) by mouth daily.   traMADol 50 MG tablet Commonly known as:  ULTRAM Take one tablet twice a day   vitamin B-12 1000  MCG tablet Commonly known as:  CYANOCOBALAMIN Take 1,000 mcg by mouth daily.            Durable Medical Equipment  (From admission, onward)        Start     Ordered   03/18/18 0000  DME Other see comment    Comments:  BP monitor Dx I10   03/18/18 1621      No orders of the defined types were placed in this encounter.   Due to the discrepancy between the office readings which are quite high on 2 occasions and the normal readings repeatedly from home, I suggested they update their blood pressure  monitor.  The one they have at home is quite old apparently.  They will continue to check the blood pressure in each arm daily and record both readings and bring those back to the office.  I recommended heat for the tendinitis for now.  He has been having some sinus congestion for that I suggested Claritin or Allegra.  He should avoid decongestants.  Follow-up: Return in about 1 month (around 04/18/2018).  Claretta Fraise, M.D.

## 2018-03-18 NOTE — Patient Instructions (Signed)
Carbohydrate Counting for Diabetes Mellitus, Adult Carbohydrate counting is a method for keeping track of how many carbohydrates you eat. Eating carbohydrates naturally increases the amount of sugar (glucose) in the blood. Counting how many carbohydrates you eat helps keep your blood glucose within normal limits, which helps you manage your diabetes (diabetes mellitus). It is important to know how many carbohydrates you can safely have in each meal. This is different for every person. A diet and nutrition specialist (registered dietitian) can help you make a meal plan and calculate how many carbohydrates you should have at each meal and snack. Carbohydrates are found in the following foods:  Grains, such as breads and cereals.  Dried beans and soy products.  Starchy vegetables, such as potatoes, peas, and corn.  Fruit and fruit juices.  Milk and yogurt.  Sweets and snack foods, such as cake, cookies, candy, chips, and soft drinks.  How do I count carbohydrates? There are two ways to count carbohydrates in food. You can use either of the methods or a combination of both. Reading "Nutrition Facts" on packaged food The "Nutrition Facts" list is included on the labels of almost all packaged foods and beverages in the [place] It includes:  The serving size.  Information about nutrients in each serving, including the grams (g) of carbohydrate per serving.  To use the "Nutrition Facts":  Decide how many servings you will have.  Multiply the number of servings by the number of carbohydrates per serving.  The resulting number is the total amount of carbohydrates that you will be having.  Learning standard serving sizes of other foods When you eat foods containing carbohydrates that are not packaged or do not include "Nutrition Facts" on the label, you need to measure the servings in order to count the amount of carbohydrates:  Measure the foods that you will eat with a food scale or  measuring cup, if needed.  Decide how many standard-size servings you will eat.  Multiply the number of servings by 15. Most carbohydrate-rich foods have about 15 g of carbohydrates per serving. ? For example, if you eat 8 oz (170 g) of strawberries, you will have eaten 2 servings and 30 g of carbohydrates (2 servings x 15 g = 30 g).  For foods that have more than one food mixed, such as soups and casseroles, you must count the carbohydrates in each food that is included.  The following list contains standard serving sizes of common carbohydrate-rich foods. Each of these servings has about 15 g of carbohydrates:   hamburger bun or  English muffin.   oz (15 mL) syrup.   oz (14 g) jelly.  1 slice of bread.  1 six-inch tortilla.  3 oz (85 g) cooked rice or pasta.  4 oz (113 g) cooked dried beans.  4 oz (113 g) starchy vegetable, such as peas, corn, or potatoes.  4 oz (113 g) hot cereal.  4 oz (113 g) mashed potatoes or  of a large baked potato.  4 oz (113 g) canned or frozen fruit.  4 oz (120 mL) fruit juice.  4-6 crackers.  6 chicken nuggets.  6 oz (170 g) unsweetened dry cereal.  6 oz (170 g) plain fat-free yogurt or yogurt sweetened with artificial sweeteners.  8 oz (240 mL) milk.  8 oz (170 g) fresh fruit or one small piece of fruit.  24 oz (680 g) popped popcorn.  Example of carbohydrate counting Sample meal  3 oz (85 g) chicken breast.    6 oz (170 g) brown rice.  4 oz (113 g) corn.  8 oz (240 mL) milk.  8 oz (170 g) strawberries with sugar-free whipped topping. Carbohydrate calculation 1. Identify the foods that contain carbohydrates: ? Rice. ? Corn. ? Milk. ? Strawberries. 2. Calculate how many servings you have of each food: ? 2 servings rice. ? 1 serving corn. ? 1 serving milk. ? 1 serving strawberries. 3. Multiply each number of servings by 15 g: ? 2 servings rice x 15 g = 30 g. ? 1 serving corn x 15 g = 15 g. ? 1 serving milk x 15  g = 15 g. ? 1 serving strawberries x 15 g = 15 g. 4. Add together all of the amounts to find the total grams of carbohydrates eaten: ? 30 g + 15 g + 15 g + 15 g = 75 g of carbohydrates total. This information is not intended to replace advice given to you by your health care provider. Make sure you discuss any questions you have with your health care provider. Document Released: 12/16/2005 Document Revised: 07/05/2016 Document Reviewed: 05/29/2016 Elsevier Interactive Patient Education  2018 [place] Eating Plan DASH stands for "Dietary Approaches to Stop Hypertension." The DASH eating plan is a healthy eating plan that has been shown to reduce high blood pressure (hypertension). It may also reduce your risk for type 2 diabetes, heart disease, and stroke. The DASH eating plan may also help with weight loss. What are tips for following this plan? General guidelines  Avoid eating more than 2,300 mg (milligrams) of salt (sodium) a day. If you have hypertension, you may need to reduce your sodium intake to 1,500 mg a day.  Limit alcohol intake to no more than 1 drink a day for nonpregnant women and 2 drinks a day for men. One drink equals 12 oz of beer, 5 oz of wine, or 1 oz of hard liquor.  Work with your health care provider to maintain a healthy body weight or to lose weight. Ask what an ideal weight is for you.  Get at least 30 minutes of exercise that causes your heart to beat faster (aerobic exercise) most days of the week. Activities may include walking, swimming, or biking.  Work with your health care provider or diet and nutrition specialist (dietitian) to adjust your eating plan to your individual calorie needs. Reading food labels  Check food labels for the amount of sodium per serving. Choose foods with less than 5 percent of the Daily Value of sodium. Generally, foods with less than 300 mg of sodium per serving fit into this eating plan.  To find whole grains, look for  the word "whole" as the first word in the ingredient list. Shopping  Buy products labeled as "low-sodium" or "no salt added."  Buy fresh foods. Avoid canned foods and premade or frozen meals. Cooking  Avoid adding salt when cooking. Use salt-free seasonings or herbs instead of table salt or sea salt. Check with your health care provider or pharmacist before using salt substitutes.  Do not fry foods. Cook foods using healthy methods such as baking, boiling, grilling, and broiling instead.  Cook with heart-healthy oils, such as olive, canola, soybean, or sunflower oil. Meal planning   Eat a balanced diet that includes: ? 5 or more servings of fruits and vegetables each day. At each meal, try to fill half of your plate with fruits and vegetables. ? Up to 6-8 servings of whole grains each  day. ? Less than 6 oz of lean meat, poultry, or fish each day. A 3-oz serving of meat is about the same size as a deck of cards. One egg equals 1 oz. ? 2 servings of low-fat dairy each day. ? A serving of nuts, seeds, or beans 5 times each week. ? Heart-healthy fats. Healthy fats called Omega-3 fatty acids are found in foods such as flaxseeds and coldwater fish, like sardines, salmon, and mackerel.  Limit how much you eat of the following: ? Canned or prepackaged foods. ? Food that is high in trans fat, such as fried foods. ? Food that is high in saturated fat, such as fatty meat. ? Sweets, desserts, sugary drinks, and other foods with added sugar. ? Full-fat dairy products.  Do not salt foods before eating.  Try to eat at least 2 vegetarian meals each week.  Eat more home-cooked food and less restaurant, buffet, and fast food.  When eating at a restaurant, ask that your food be prepared with less salt or no salt, if possible. What foods are recommended? The items listed may not be a complete list. Talk with your dietitian about what dietary choices are best for you. Grains Whole-grain or  whole-wheat bread. Whole-grain or whole-wheat pasta. Brown rice. [place]. Bulgur. Whole-grain and low-sodium cereals. Pita bread. Low-fat, low-sodium crackers. Whole-wheat flour tortillas. Vegetables Fresh or frozen vegetables (raw, steamed, roasted, or grilled). Low-sodium or reduced-sodium tomato and vegetable juice. Low-sodium or reduced-sodium tomato sauce and tomato paste. Low-sodium or reduced-sodium canned vegetables. Fruits All fresh, dried, or frozen fruit. Canned fruit in natural juice (without added sugar). Meat and other protein foods Skinless chicken or [place]. Ground chicken or [place]. Pork with fat trimmed off. Fish and seafood. Egg whites. Dried beans, peas, or lentils. Unsalted nuts, nut butters, and seeds. Unsalted canned beans. Lean cuts of beef with fat trimmed off. Low-sodium, lean deli meat. Dairy Low-fat (1%) or fat-free (skim) milk. Fat-free, low-fat, or reduced-fat cheeses. Nonfat, low-sodium ricotta or cottage cheese. Low-fat or nonfat yogurt. Low-fat, low-sodium cheese. Fats and oils Soft margarine without trans fats. Vegetable oil. Low-fat, reduced-fat, or light mayonnaise and salad dressings (reduced-sodium). Canola, safflower, olive, soybean, and sunflower oils. Avocado. Seasoning and other foods Herbs. Spices. Seasoning mixes without salt. Unsalted popcorn and pretzels. Fat-free sweets. What foods are not recommended? The items listed may not be a complete list. Talk with your dietitian about what dietary choices are best for you. Grains Baked goods made with fat, such as croissants, muffins, or some breads. Dry pasta or rice meal packs. Vegetables Creamed or fried vegetables. Vegetables in a cheese sauce. Regular canned vegetables (not low-sodium or reduced-sodium). Regular canned tomato sauce and paste (not low-sodium or reduced-sodium). Regular tomato and vegetable juice (not low-sodium or reduced-sodium). Angie Fava. Olives. Fruits Canned fruit in a light  or heavy syrup. Fried fruit. Fruit in cream or butter sauce. Meat and other protein foods Fatty cuts of meat. Ribs. Fried meat. Berniece Salines. Sausage. Bologna and other processed lunch meats. Salami. Fatback. Hotdogs. Bratwurst. Salted nuts and seeds. Canned beans with added salt. Canned or smoked fish. Whole eggs or egg yolks. Chicken or [place] with skin. Dairy Whole or 2% milk, cream, and half-and-half. Whole or full-fat cream cheese. Whole-fat or sweetened yogurt. Full-fat cheese. Nondairy creamers. Whipped toppings. Processed cheese and cheese spreads. Fats and oils Butter. Stick margarine. Lard. Shortening. Ghee. Bacon fat. Tropical oils, such as coconut, palm kernel, or palm oil. Seasoning and other foods Salted popcorn and pretzels.  Onion salt, garlic salt, seasoned salt, table salt, and sea salt. Worcestershire sauce. Tartar sauce. Barbecue sauce. Teriyaki sauce. Soy sauce, including reduced-sodium. Steak sauce. Canned and packaged gravies. Fish sauce. Oyster sauce. Cocktail sauce. Horseradish that you find on the shelf. Ketchup. Mustard. Meat flavorings and tenderizers. Bouillon cubes. Hot sauce and Tabasco sauce. Premade or packaged marinades. Premade or packaged taco seasonings. Relishes. Regular salad dressings. Where to find more information:  National Heart, Lung, and [place]: https://wilson-eaton.com/  American Heart Association: www.heart.org Summary  The DASH eating plan is a healthy eating plan that has been shown to reduce high blood pressure (hypertension). It may also reduce your risk for type 2 diabetes, heart disease, and stroke.  With the DASH eating plan, you should limit salt (sodium) intake to 2,300 mg a day. If you have hypertension, you may need to reduce your sodium intake to 1,500 mg a day.  When on the DASH eating plan, aim to eat more fresh fruits and vegetables, whole grains, lean proteins, low-fat dairy, and heart-healthy fats.  Work with your health care provider or  diet and nutrition specialist (dietitian) to adjust your eating plan to your individual calorie needs. This information is not intended to replace advice given to you by your health care provider. Make sure you discuss any questions you have with your health care provider. Document Released: 12/05/2011 Document Revised: 12/09/2016 Document Reviewed: 12/09/2016 Elsevier Interactive Patient Education  [employer].

## 2018-03-27 ENCOUNTER — Ambulatory Visit (INDEPENDENT_AMBULATORY_CARE_PROVIDER_SITE_OTHER): Payer: Medicare Other | Admitting: Family Medicine

## 2018-03-27 ENCOUNTER — Encounter: Payer: Self-pay | Admitting: Family Medicine

## 2018-03-27 VITALS — BP 106/83 | HR 95 | Temp 97.1°F | Ht 69.0 in | Wt 166.2 lb

## 2018-03-27 DIAGNOSIS — R42 Dizziness and giddiness: Secondary | ICD-10-CM | POA: Diagnosis not present

## 2018-03-27 NOTE — Progress Notes (Signed)
Subjective:  Patient ID: Jose Hardin, male    DOB: 10/25/1930  Age: 84 y.o. MRN: [id number]  CC: Dizziness (pt here today c/o being dizzy )   HPI Jose Hardin presents for Onset of moderately severe dizziness this AM. Off balance. Light headed. No LOC. Vague Chest pain without radiation or dyspnea. No diaphoresis. The dizziness has been intermittent all day. It has decreased.    Depression screen [facility] 2/9 03/18/2018 08/06/2017 12/06/2016  Decreased Interest 1 0 0  Down, Depressed, Hopeless 0 0 0  PHQ - 2 Score 1 0 0    History Jose Hardin has a past medical history of BPH (benign prostatic hyperplasia), Carotid bruit present, Colovesical fistula, GERD (gastroesophageal reflux disease), Heart murmur, Hyperlipidemia, Hypertension, Kidney stones, Osteoporosis, Prediabetes, Stroke ([place]) (11/26/2016), and Vertebral compression fracture ([place]).   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   His family history is not on file.He reports that he quit smoking about 36 years ago. He has never used smokeless tobacco. He reports that he does not drink alcohol or use drugs.    ROS Review of Systems  Constitutional: Negative.   HENT: Negative.   Eyes: Negative for visual disturbance.  Respiratory: Negative for cough and shortness of breath.   Cardiovascular: Negative for chest pain and leg swelling.  Gastrointestinal: Negative for abdominal pain, diarrhea, nausea and vomiting.  Genitourinary: Negative for difficulty urinating.  Musculoskeletal: Negative for arthralgias and myalgias.  Skin: Negative for rash.  Neurological: Negative for headaches.  Psychiatric/Behavioral: Negative for sleep disturbance.    Objective:  BP 106/83 Comment: right arm  Pulse 95   Temp (!) 97.1 F (36.2 C) (Oral)   Ht 5\' 9"  (1.753 m)   Wt 166 lb 4 oz (75.4 kg)   BMI 24.55 kg/m   BP  Readings from Last 3 Encounters:  03/27/18 106/83  03/18/18 117/80  02/18/18 (!) 164/82    Wt Readings from Last 3 Encounters:  03/27/18 166 lb 4 oz (75.4 kg)  03/18/18 165 lb 2 oz (74.9 kg)  02/18/18 166 lb (75.3 kg)     Physical Exam  Constitutional: He is oriented to person, place, and time. He appears well-developed and well-nourished. No distress.  HENT:  Head: Normocephalic and atraumatic.  Right Ear: External ear normal.  Left Ear: External ear normal.  Nose: Nose normal.  Mouth/Throat: Oropharynx is clear and moist.  Eyes: Pupils are equal, round, and reactive to light. Conjunctivae and EOM are normal.  Neck: Normal range of motion. Neck supple. No thyromegaly present.  Cardiovascular: Normal rate, regular rhythm and normal heart sounds.  No murmur heard. Pulmonary/Chest: Effort normal and breath sounds normal. No respiratory distress. He has no wheezes. He has no rales.  Abdominal: Soft. Bowel sounds are normal. He exhibits no distension. There is no tenderness.  Musculoskeletal: Normal range of motion. He exhibits no edema or tenderness.  Lymphadenopathy:    He has no cervical adenopathy.  Neurological: He is alert and oriented to person, place, and time. He has normal reflexes.  Skin: Skin is warm and dry.  Psychiatric: He has a normal mood and affect. His behavior is normal.      Assessment & Plan:   Jose Hardin was seen today for dizziness.  Diagnoses and all orders for this visit:  Dizzy -     EKG 12-Lead    EKG shows no  acute ischemia. NSR.    I am having Jose Hardin maintain his aspirin, meclizine, traMADol, donepezil, vitamin B-12, MAGNESIUM MALATE PO, pantoprazole, and alendronate.  Allergies as of 03/27/2018      Reactions   Namenda [memantine Hcl] Other (See Comments)   Somnolence   Pregabalin Other (See Comments)   Soap Rash      Medication List        Accurate as of 03/27/18 11:59 PM. Always use your most recent med list.            alendronate 70 MG tablet Commonly known as:  FOSAMAX TAKE 1 TABLET ONCE A WEEK AS DIRECTED .   aspirin 325 MG tablet Take 325 mg by mouth daily.   donepezil 10 MG tablet Commonly known as:  ARICEPT Take 5 mg by mouth 2 (two) times daily.   MAGNESIUM MALATE PO Take 1,350 mg by mouth 2 (two) times daily.   meclizine 25 MG tablet Commonly known as:  ANTIVERT Take 1 tablet (25 mg total) by mouth 3 (three) times daily as needed for dizziness.   pantoprazole 40 MG tablet Commonly known as:  PROTONIX Take 1 tablet (40 mg total) by mouth daily.   traMADol 50 MG tablet Commonly known as:  ULTRAM Take one tablet twice a day   vitamin B-12 1000 MCG tablet Commonly known as:  CYANOCOBALAMIN Take 1,000 mcg by mouth daily.        Follow-up: Return if symptoms worsen or fail to improve.  Claretta Fraise, M.D.

## 2018-04-03 DIAGNOSIS — G309 Alzheimer's disease, unspecified: Secondary | ICD-10-CM | POA: Diagnosis not present

## 2018-04-04 ENCOUNTER — Encounter: Payer: Self-pay | Admitting: Family Medicine

## 2018-04-15 ENCOUNTER — Encounter: Payer: Self-pay | Admitting: Family Medicine

## 2018-04-15 ENCOUNTER — Ambulatory Visit (INDEPENDENT_AMBULATORY_CARE_PROVIDER_SITE_OTHER): Payer: Medicare Other | Admitting: Family Medicine

## 2018-04-15 VITALS — BP 114/81 | HR 68 | Temp 96.8°F | Ht 69.0 in | Wt 164.2 lb

## 2018-04-15 DIAGNOSIS — I10 Essential (primary) hypertension: Secondary | ICD-10-CM

## 2018-04-15 MED ORDER — DONEPEZIL HCL 10 MG PO TABS: ORAL_TABLET | ORAL | 0 refills | Status: DC

## 2018-04-16 ENCOUNTER — Other Ambulatory Visit: Payer: Self-pay | Admitting: Family Medicine

## 2018-04-16 ENCOUNTER — Telehealth: Payer: Self-pay | Admitting: Family Medicine

## 2018-04-16 MED ORDER — DONEPEZIL HCL 5 MG PO TABS: 7.5000 mg | ORAL_TABLET | Freq: Two times a day (BID) | ORAL | 5 refills | Status: DC

## 2018-04-16 MED ORDER — DONEPEZIL HCL 5 MG PO TABS: 7.5000 mg | ORAL_TABLET | Freq: Every day | ORAL | 1 refills | Status: DC

## 2018-04-16 NOTE — Telephone Encounter (Signed)
I sent in a corrected prescription for the 5 mg tablet.  He should take 1-1/2 tablets twice daily.

## 2018-04-16 NOTE — Telephone Encounter (Signed)
Patient states that he was previously taking 5 mg of donepezil bid but a prescription for donepezil 10 1.5 tablet daily was sent in yesterday.. What dose would you like for patient to take

## 2018-04-16 NOTE — Addendum Note (Signed)
Addended by: Marylin Crosby on: 04/16/2018 02:07 PM   Modules accepted: Orders

## 2018-04-16 NOTE — Telephone Encounter (Signed)
Pt's daughter aware rx sent over as requested.

## 2018-04-19 ENCOUNTER — Encounter: Payer: Self-pay | Admitting: Family Medicine

## 2018-04-19 NOTE — Progress Notes (Signed)
Subjective:  Patient ID: Jose Hardin, male    DOB: 12-Nov-1930  Age: 84 y.o. MRN: [id number]  CC: Follow-up (pt here today for 1 month follow up)   HPI Jose Hardin presents for  follow-up of hypertension. Patient has no history of headache chest pain or shortness of breath or recent cough. Patient also denies symptoms of TIA such as focal numbness or weakness. Jose Hardin BP readings at home show 30-40 points difference between right and left arm.   History Jose Hardin has a past medical history of BPH (benign prostatic hyperplasia), Carotid bruit present, Colovesical fistula, GERD (gastroesophageal reflux disease), Heart murmur, Hyperlipidemia, Hypertension, Kidney stones, Osteoporosis, Prediabetes, Stroke ([place]) (11/26/2016), and Vertebral compression fracture ([place]).   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   Jose Hardin family history is not on file.Jose Hardin reports that Jose Hardin quit smoking about 36 years ago. Jose Hardin has never used smokeless tobacco. Jose Hardin reports that Jose Hardin does not drink alcohol or use drugs.  Current Outpatient Medications on File Prior to Visit  Medication Sig Dispense Refill  . alendronate (FOSAMAX) 70 MG tablet TAKE 1 TABLET ONCE A WEEK AS DIRECTED . 4 tablet 3  . aspirin 325 MG tablet Take 325 mg by mouth daily.    Marland Kitchen MAGNESIUM MALATE PO Take 1,350 mg by mouth 2 (two) times daily.    . meclizine (ANTIVERT) 25 MG tablet Take 1 tablet (25 mg total) by mouth 3 (three) times daily as needed for dizziness. 90 tablet 0  . pantoprazole (PROTONIX) 40 MG tablet Take 1 tablet (40 mg total) by mouth daily. 30 tablet 5  . traMADol (ULTRAM) 50 MG tablet Take one tablet twice a day (Patient taking differently: Take 50 mg by mouth as needed. Take one tablet twice a day) 60 tablet 0  . vitamin B-12 (CYANOCOBALAMIN) 1000 MCG tablet Take 1,000 mcg by mouth daily.     No current  facility-administered medications on file prior to visit.     ROS Review of Systems  Constitutional: Negative.   HENT: Negative.   Eyes: Negative for visual disturbance.  Respiratory: Negative for cough and shortness of breath.   Cardiovascular: Negative for chest pain and leg swelling.  Gastrointestinal: Negative for abdominal pain, diarrhea, nausea and vomiting.  Genitourinary: Negative for difficulty urinating.  Musculoskeletal: Negative for arthralgias and myalgias.  Skin: Negative for rash.  Neurological: Negative for headaches.  Psychiatric/Behavioral: Negative for sleep disturbance.    Objective:  BP 114/81 Comment: right arm  Pulse 68   Temp (!) 96.8 F (36 C) (Oral)   Ht 5\' 9"  (1.753 m)   Wt 164 lb 4 oz (74.5 kg)   BMI 24.26 kg/m   BP Readings from Last 3 Encounters:  04/15/18 114/81  03/27/18 106/83  03/18/18 117/80    Wt Readings from Last 3 Encounters:  04/15/18 164 lb 4 oz (74.5 kg)  03/27/18 166 lb 4 oz (75.4 kg)  03/18/18 165 lb 2 oz (74.9 kg)     Physical Exam  Constitutional: Jose Hardin is oriented to person, place, and time. Jose Hardin appears well-developed.  HENT:  Head: Normocephalic and atraumatic.  Right Ear: External ear normal.  Left Ear: External ear normal.  Nose: Nose normal.  Mouth/Throat: Oropharynx is clear and moist.  Eyes: Pupils are equal, round, and reactive to light. Conjunctivae and EOM are normal. Right eye exhibits no discharge. Left eye exhibits  no discharge.  Neck: Normal range of motion. Neck supple. No JVD present. No thyromegaly present.  Cardiovascular: Normal rate, regular rhythm and normal heart sounds. Exam reveals no gallop and no friction rub.  No murmur heard. Pulmonary/Chest: Effort normal and breath sounds normal. No stridor. No respiratory distress. Jose Hardin has no wheezes. Jose Hardin has no rales. Jose Hardin exhibits no tenderness.  Abdominal: Soft. Bowel sounds are normal. Jose Hardin exhibits no distension and no mass. There is no tenderness. There is  no rebound and no guarding. A hernia is present.  Genitourinary: Prostate normal and penis normal.  Lymphadenopathy:    Jose Hardin has no cervical adenopathy.  Neurological: Jose Hardin is alert and oriented to person, place, and time. Jose Hardin displays normal reflexes. No cranial nerve deficit. Jose Hardin exhibits normal muscle tone. Coordination normal.  Skin: Skin is warm and dry. No rash noted. No erythema.  Psychiatric: Jose Hardin has a normal mood and affect. Jose Hardin behavior is normal. Judgment and thought content normal.  Vitals reviewed.     Assessment & Plan:   Jose Hardin was seen today for follow-up.  Diagnoses and all orders for this visit:  Essential hypertension -     Ambulatory referral to Cardiology  Other orders -     Discontinue: donepezil (ARICEPT) 10 MG tablet; Take 1.5 tablets daily.   Allergies as of 04/15/2018      Reactions   Namenda [memantine Hcl] Other (See Comments)   Somnolence   Pregabalin Other (See Comments)   Soap Rash      Medication List        Accurate as of 04/15/18 11:59 PM. Always use your most recent med list.          alendronate 70 MG tablet Commonly known as:  FOSAMAX TAKE 1 TABLET ONCE A WEEK AS DIRECTED .   aspirin 325 MG tablet Take 325 mg by mouth daily.   donepezil 10 MG tablet Commonly known as:  ARICEPT Take 1.5 tablets daily.   MAGNESIUM MALATE PO Take 1,350 mg by mouth 2 (two) times daily.   meclizine 25 MG tablet Commonly known as:  ANTIVERT Take 1 tablet (25 mg total) by mouth 3 (three) times daily as needed for dizziness.   pantoprazole 40 MG tablet Commonly known as:  PROTONIX Take 1 tablet (40 mg total) by mouth daily.   traMADol 50 MG tablet Commonly known as:  ULTRAM Take one tablet twice a day   vitamin B-12 1000 MCG tablet Commonly known as:  CYANOCOBALAMIN Take 1,000 mcg by mouth daily.       Meds ordered this encounter  Medications  . DISCONTD: donepezil (ARICEPT) 10 MG tablet    Sig: Take 1.5 tablets daily.    Dispense:  45  tablet    Refill:  0    The large discrepancy between the arms leads me to believe that there is potentially a constricted vessel perhaps the right subclavian.  I would like to have cardiology review Jose Hardin case closely to see if Jose Hardin needs arteriography.  However, Jose Hardin at Jose Hardin advanced age, risk benefit analysis is essential as well.  Follow-up: Return in about 1 month (around 05/15/2018).  Claretta Fraise, M.D.

## 2018-04-28 ENCOUNTER — Telehealth: Payer: Self-pay | Admitting: Family Medicine

## 2018-04-29 NOTE — Telephone Encounter (Signed)
lmtcb

## 2018-05-13 NOTE — Telephone Encounter (Signed)
NA

## 2018-05-19 DIAGNOSIS — R6889 Other general symptoms and signs: Secondary | ICD-10-CM | POA: Diagnosis not present

## 2018-05-19 DIAGNOSIS — I951 Orthostatic hypotension: Secondary | ICD-10-CM | POA: Diagnosis not present

## 2018-05-27 NOTE — Telephone Encounter (Signed)
Pt has a one month Follow up scheduled 05/29/18 with [facility]

## 2018-05-29 ENCOUNTER — Ambulatory Visit (INDEPENDENT_AMBULATORY_CARE_PROVIDER_SITE_OTHER): Payer: Medicare Other | Admitting: Family Medicine

## 2018-05-29 ENCOUNTER — Encounter: Payer: Self-pay | Admitting: Family Medicine

## 2018-05-29 VITALS — BP 127/86 | HR 56 | Temp 97.0°F | Ht 69.0 in | Wt 162.0 lb

## 2018-05-29 DIAGNOSIS — I10 Essential (primary) hypertension: Secondary | ICD-10-CM

## 2018-05-29 MED ORDER — ALENDRONATE SODIUM 70 MG PO TABS: ORAL_TABLET | ORAL | 3 refills | Status: DC

## 2018-05-29 NOTE — Progress Notes (Signed)
Subjective:  Patient ID: Jose Hardin, male    DOB: 03/10/1930  Age: 84 y.o. MRN: [id number]  CC: Follow-up (pt here today for 1 month follow up)   HPI Jose Hardin presents for  follow-up of hypertension. Patient has no history of headache chest pain or shortness of breath or recent cough. Patient also denies symptoms of TIA such as focal numbness or weakness. Patient denies side effects from medication. States taking it regularly.   History Jose Hardin has a past medical history of BPH (benign prostatic hyperplasia), Carotid bruit present, Colovesical fistula, GERD (gastroesophageal reflux disease), Heart murmur, Hyperlipidemia, Hypertension, Kidney stones, Osteoporosis, Prediabetes, Stroke ([place]) (11/26/2016), and Vertebral compression fracture ([place]).   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   His family history is not on file.He reports that he quit smoking about 36 years ago. He has never used smokeless tobacco. He reports that he does not drink alcohol or use drugs.  Current Outpatient Medications on File Prior to Visit  Medication Sig Dispense Refill  . aspirin 325 MG tablet Take 325 mg by mouth daily.    Marland Kitchen donepezil (ARICEPT) 5 MG tablet Take 1.5 tablets (7.5 mg total) by mouth at bedtime. 135 tablet 1  . MAGNESIUM MALATE PO Take 1,350 mg by mouth 2 (two) times daily.    . meclizine (ANTIVERT) 25 MG tablet Take 1 tablet (25 mg total) by mouth 3 (three) times daily as needed for dizziness. 90 tablet 0  . pantoprazole (PROTONIX) 40 MG tablet Take 1 tablet (40 mg total) by mouth daily. 30 tablet 5  . traMADol (ULTRAM) 50 MG tablet Take one tablet twice a day (Patient taking differently: Take 50 mg by mouth as needed. Take one tablet twice a day) 60 tablet 0  . vitamin B-12 (CYANOCOBALAMIN) 1000 MCG tablet Take 1,000 mcg by mouth daily.     No current  facility-administered medications on file prior to visit.     ROS Review of Systems  Constitutional: Negative.   HENT: Negative.   Eyes: Negative for visual disturbance.  Respiratory: Negative for cough and shortness of breath.   Cardiovascular: Negative for chest pain and leg swelling.  Gastrointestinal: Negative for abdominal pain, diarrhea, nausea and vomiting.  Genitourinary: Negative for difficulty urinating.  Musculoskeletal: Negative for arthralgias and myalgias.  Skin: Negative for rash.  Neurological: Negative for headaches.  Psychiatric/Behavioral: Negative for sleep disturbance.    Objective:  BP 127/86   Pulse (!) 56   Temp (!) 97 F (36.1 C) (Oral)   Ht 5\' 9"  (1.753 m)   Wt 162 lb (73.5 kg)   BMI 23.92 kg/m   BP Readings from Last 3 Encounters:  05/29/18 127/86  04/15/18 114/81  03/27/18 106/83    Wt Readings from Last 3 Encounters:  05/29/18 162 lb (73.5 kg)  04/15/18 164 lb 4 oz (74.5 kg)  03/27/18 166 lb 4 oz (75.4 kg)     Physical Exam  Constitutional: He appears well-developed and well-nourished.  HENT:  Head: Normocephalic and atraumatic.  Right Ear: Tympanic membrane and external ear normal. No decreased hearing is noted.  Left Ear: Tympanic membrane and external ear normal. No decreased hearing is noted.  Mouth/Throat: No oropharyngeal exudate or posterior oropharyngeal erythema.  Eyes: Pupils are equal, round, and reactive to light.  Neck: Normal range of motion. Neck supple.  Cardiovascular: Normal rate and  regular rhythm.  No murmur heard. Pulmonary/Chest: Breath sounds normal. No respiratory distress.  Abdominal: Soft. Bowel sounds are normal. He exhibits no mass. There is no tenderness.  Vitals reviewed.     Assessment & Plan:   Kaliq was seen today for follow-up.  Diagnoses and all orders for this visit:  Essential hypertension  Other orders -     alendronate (FOSAMAX) 70 MG tablet; TAKE 1 TABLET ONCE A WEEK AS DIRECTED  .   Allergies as of 05/29/2018      Reactions   Namenda [memantine Hcl] Other (See Comments)   Somnolence   Pregabalin Other (See Comments)   Soap Rash      Medication List        Accurate as of 05/29/18 11:59 PM. Always use your most recent med list.          alendronate 70 MG tablet Commonly known as:  FOSAMAX TAKE 1 TABLET ONCE A WEEK AS DIRECTED .   aspirin 325 MG tablet Take 325 mg by mouth daily.   donepezil 5 MG tablet Commonly known as:  ARICEPT Take 1.5 tablets (7.5 mg total) by mouth at bedtime.   MAGNESIUM MALATE PO Take 1,350 mg by mouth 2 (two) times daily.   meclizine 25 MG tablet Commonly known as:  ANTIVERT Take 1 tablet (25 mg total) by mouth 3 (three) times daily as needed for dizziness.   pantoprazole 40 MG tablet Commonly known as:  PROTONIX Take 1 tablet (40 mg total) by mouth daily.   traMADol 50 MG tablet Commonly known as:  ULTRAM Take one tablet twice a day   vitamin B-12 1000 MCG tablet Commonly known as:  CYANOCOBALAMIN Take 1,000 mcg by mouth daily.       Meds ordered this encounter  Medications  . alendronate (FOSAMAX) 70 MG tablet    Sig: TAKE 1 TABLET ONCE A WEEK AS DIRECTED .    Dispense:  4 tablet    Refill:  3      Follow-up: Return in about 3 months (around 08/29/2018).  Claretta Fraise, M.D.

## 2018-06-05 ENCOUNTER — Encounter: Payer: Self-pay | Admitting: Family Medicine

## 2018-06-09 DIAGNOSIS — R6889 Other general symptoms and signs: Secondary | ICD-10-CM | POA: Diagnosis not present

## 2018-06-25 DIAGNOSIS — R0989 Other specified symptoms and signs involving the circulatory and respiratory systems: Secondary | ICD-10-CM | POA: Diagnosis not present

## 2018-06-25 DIAGNOSIS — R42 Dizziness and giddiness: Secondary | ICD-10-CM | POA: Diagnosis not present

## 2018-06-25 DIAGNOSIS — I10 Essential (primary) hypertension: Secondary | ICD-10-CM | POA: Diagnosis not present

## 2018-06-29 DIAGNOSIS — R0989 Other specified symptoms and signs involving the circulatory and respiratory systems: Secondary | ICD-10-CM | POA: Diagnosis not present

## 2018-06-29 DIAGNOSIS — R42 Dizziness and giddiness: Secondary | ICD-10-CM | POA: Diagnosis not present

## 2018-07-14 DIAGNOSIS — I771 Stricture of artery: Secondary | ICD-10-CM | POA: Diagnosis not present

## 2018-07-14 DIAGNOSIS — I10 Essential (primary) hypertension: Secondary | ICD-10-CM | POA: Diagnosis not present

## 2018-08-03 DIAGNOSIS — I517 Cardiomegaly: Secondary | ICD-10-CM | POA: Diagnosis not present

## 2018-08-03 DIAGNOSIS — N261 Atrophy of kidney (terminal): Secondary | ICD-10-CM | POA: Diagnosis not present

## 2018-08-03 DIAGNOSIS — K551 Chronic vascular disorders of intestine: Secondary | ICD-10-CM | POA: Diagnosis not present

## 2018-08-03 DIAGNOSIS — R0600 Dyspnea, unspecified: Secondary | ICD-10-CM | POA: Diagnosis not present

## 2018-08-03 DIAGNOSIS — Z79899 Other long term (current) drug therapy: Secondary | ICD-10-CM | POA: Diagnosis not present

## 2018-08-03 DIAGNOSIS — R42 Dizziness and giddiness: Secondary | ICD-10-CM | POA: Diagnosis not present

## 2018-08-03 DIAGNOSIS — R918 Other nonspecific abnormal finding of lung field: Secondary | ICD-10-CM | POA: Diagnosis not present

## 2018-08-03 DIAGNOSIS — K769 Liver disease, unspecified: Secondary | ICD-10-CM | POA: Diagnosis not present

## 2018-08-03 DIAGNOSIS — E785 Hyperlipidemia, unspecified: Secondary | ICD-10-CM | POA: Diagnosis not present

## 2018-08-03 DIAGNOSIS — R911 Solitary pulmonary nodule: Secondary | ICD-10-CM | POA: Diagnosis not present

## 2018-08-03 DIAGNOSIS — J984 Other disorders of lung: Secondary | ICD-10-CM | POA: Diagnosis not present

## 2018-08-03 DIAGNOSIS — R932 Abnormal findings on diagnostic imaging of liver and biliary tract: Secondary | ICD-10-CM | POA: Diagnosis not present

## 2018-08-03 DIAGNOSIS — R0602 Shortness of breath: Secondary | ICD-10-CM | POA: Diagnosis not present

## 2018-08-03 DIAGNOSIS — I10 Essential (primary) hypertension: Secondary | ICD-10-CM | POA: Diagnosis not present

## 2018-08-03 DIAGNOSIS — K219 Gastro-esophageal reflux disease without esophagitis: Secondary | ICD-10-CM | POA: Diagnosis not present

## 2018-08-03 DIAGNOSIS — Z888 Allergy status to other drugs, medicaments and biological substances status: Secondary | ICD-10-CM | POA: Diagnosis not present

## 2018-08-03 DIAGNOSIS — Z8673 Personal history of transient ischemic attack (TIA), and cerebral infarction without residual deficits: Secondary | ICD-10-CM | POA: Diagnosis not present

## 2018-08-03 DIAGNOSIS — G319 Degenerative disease of nervous system, unspecified: Secondary | ICD-10-CM | POA: Diagnosis not present

## 2018-08-03 DIAGNOSIS — Z87891 Personal history of nicotine dependence: Secondary | ICD-10-CM | POA: Diagnosis not present

## 2018-08-03 DIAGNOSIS — Z91048 Other nonmedicinal substance allergy status: Secondary | ICD-10-CM | POA: Diagnosis not present

## 2018-08-03 DIAGNOSIS — M1990 Unspecified osteoarthritis, unspecified site: Secondary | ICD-10-CM | POA: Diagnosis not present

## 2018-08-04 ENCOUNTER — Telehealth: Payer: Self-pay | Admitting: Family Medicine

## 2018-08-04 NOTE — Telephone Encounter (Signed)
Just need to see him if the dyspnea returns

## 2018-08-04 NOTE — Telephone Encounter (Signed)
Spoke with daugter and wanted Dr. Artemio Aly to decide If patient ntbs before his 3 month follow up 8/27? Patient went to ER yesterday for dizziness and SOB and states he feels fine now. Daughter also states that he has an apt with cardiology and vascular in the next 2 weeks. Wanting to know if Dr. Artemio Aly thinks it is okay to wait until his apt with him 8/27 since he is seeing specialist? Please advise

## 2018-08-04 NOTE — Telephone Encounter (Signed)
Left message to call back  

## 2018-08-17 DIAGNOSIS — R42 Dizziness and giddiness: Secondary | ICD-10-CM | POA: Diagnosis not present

## 2018-08-17 DIAGNOSIS — Z049 Encounter for examination and observation for unspecified reason: Secondary | ICD-10-CM | POA: Diagnosis not present

## 2018-08-18 ENCOUNTER — Ambulatory Visit: Payer: Medicare Other | Admitting: Family Medicine

## 2018-08-18 DIAGNOSIS — Z79899 Other long term (current) drug therapy: Secondary | ICD-10-CM | POA: Diagnosis not present

## 2018-08-18 DIAGNOSIS — I10 Essential (primary) hypertension: Secondary | ICD-10-CM | POA: Diagnosis not present

## 2018-08-18 DIAGNOSIS — R42 Dizziness and giddiness: Secondary | ICD-10-CM | POA: Diagnosis not present

## 2018-08-18 DIAGNOSIS — Z87891 Personal history of nicotine dependence: Secondary | ICD-10-CM | POA: Diagnosis not present

## 2018-08-18 DIAGNOSIS — R079 Chest pain, unspecified: Secondary | ICD-10-CM | POA: Diagnosis not present

## 2018-08-18 DIAGNOSIS — Z8673 Personal history of transient ischemic attack (TIA), and cerebral infarction without residual deficits: Secondary | ICD-10-CM | POA: Diagnosis not present

## 2018-08-18 DIAGNOSIS — E785 Hyperlipidemia, unspecified: Secondary | ICD-10-CM | POA: Diagnosis not present

## 2018-08-18 DIAGNOSIS — J9811 Atelectasis: Secondary | ICD-10-CM | POA: Diagnosis not present

## 2018-08-18 NOTE — Telephone Encounter (Signed)
Patient currently in the ED has appt 8/27

## 2018-08-24 DIAGNOSIS — J439 Emphysema, unspecified: Secondary | ICD-10-CM | POA: Diagnosis not present

## 2018-08-24 DIAGNOSIS — R42 Dizziness and giddiness: Secondary | ICD-10-CM | POA: Diagnosis not present

## 2018-08-24 DIAGNOSIS — I771 Stricture of artery: Secondary | ICD-10-CM | POA: Diagnosis not present

## 2018-08-24 DIAGNOSIS — Z87891 Personal history of nicotine dependence: Secondary | ICD-10-CM | POA: Diagnosis not present

## 2018-08-24 DIAGNOSIS — I2510 Atherosclerotic heart disease of native coronary artery without angina pectoris: Secondary | ICD-10-CM | POA: Diagnosis not present

## 2018-08-24 DIAGNOSIS — I10 Essential (primary) hypertension: Secondary | ICD-10-CM | POA: Diagnosis not present

## 2018-08-24 DIAGNOSIS — N183 Chronic kidney disease, stage 3 (moderate): Secondary | ICD-10-CM | POA: Diagnosis not present

## 2018-08-25 ENCOUNTER — Encounter: Payer: Self-pay | Admitting: Family Medicine

## 2018-08-25 ENCOUNTER — Ambulatory Visit (INDEPENDENT_AMBULATORY_CARE_PROVIDER_SITE_OTHER): Payer: Medicare Other | Admitting: Family Medicine

## 2018-08-25 VITALS — BP 151/80 | HR 70 | Temp 96.8°F | Ht 69.0 in | Wt 159.5 lb

## 2018-08-25 DIAGNOSIS — K2100 Gastro-esophageal reflux disease with esophagitis, without bleeding: Secondary | ICD-10-CM

## 2018-08-25 DIAGNOSIS — I10 Essential (primary) hypertension: Secondary | ICD-10-CM | POA: Diagnosis not present

## 2018-08-25 MED ORDER — OMEPRAZOLE 40 MG PO CPDR: 40.0000 mg | DELAYED_RELEASE_CAPSULE | Freq: Every day | ORAL | 3 refills | Status: AC

## 2018-08-25 MED ORDER — ALENDRONATE SODIUM 70 MG PO TABS: ORAL_TABLET | ORAL | 3 refills | Status: DC

## 2018-08-25 NOTE — Progress Notes (Signed)
Subjective:  Patient ID: Jose Hardin, male    DOB: 1930-08-25  Age: 84 y.o. MRN: [id number]  CC: Medical Management of Chronic Issues (pt needs to discuss changing protonix back to omeprazole and refills on fosamax)   HPI Jose Hardin presents for  follow-up of hypertension. Patient has no history of headache chest pain or shortness of breath or recent cough. Patient also denies symptoms of TIA such as focal numbness or weakness. Patient denies side effects from medication. States taking it regularly.  Patient also wishes to switch back to omeprazole from Protonix just because that seemed to work better for her.  She is now having some recurrence of her reflux symptoms since making the switch.   History Jose Hardin has a past medical history of BPH (benign prostatic hyperplasia), Carotid bruit present, Colovesical fistula, GERD (gastroesophageal reflux disease), Heart murmur, Hyperlipidemia, Hypertension, Kidney stones, Osteoporosis, Prediabetes, Stroke ([facility]) (11/26/2016), and Vertebral compression fracture ([place]).   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   His family history is not on file.He reports that he quit smoking about 36 years ago. He has never used smokeless tobacco. He reports that he does not drink alcohol or use drugs.  Current Outpatient Medications on File Prior to Visit  Medication Sig Dispense Refill  . amLODipine (NORVASC) 5 MG tablet Take by mouth.    Marland Kitchen aspirin 325 MG tablet Take 325 mg by mouth daily.    Marland Kitchen donepezil (ARICEPT) 5 MG tablet Take 1.5 tablets (7.5 mg total) by mouth at bedtime. 135 tablet 1  . MAGNESIUM MALATE PO Take 1,350 mg by mouth 2 (two) times daily.    . traMADol (ULTRAM) 50 MG tablet Take one tablet twice a day (Patient taking differently: Take 50 mg by mouth as needed. Take one tablet twice a day) 60 tablet 0  .  vitamin B-12 (CYANOCOBALAMIN) 1000 MCG tablet Take 1,000 mcg by mouth daily.     No current facility-administered medications on file prior to visit.     ROS Review of Systems  Constitutional: Negative for fever.  Respiratory: Negative for shortness of breath.   Cardiovascular: Negative for chest pain.  Gastrointestinal: Positive for abdominal pain. Negative for abdominal distention.  Musculoskeletal: Negative for arthralgias.  Skin: Negative for rash.    Objective:  BP (!) 151/80   Pulse 70   Temp (!) 96.8 F (36 C) (Oral)   Ht 5\' 9"  (1.753 m)   Wt 159 lb 8 oz (72.3 kg)   BMI 23.55 kg/m   BP Readings from Last 3 Encounters:  08/25/18 (!) 151/80  05/29/18 127/86  04/15/18 114/81    Wt Readings from Last 3 Encounters:  08/25/18 159 lb 8 oz (72.3 kg)  05/29/18 162 lb (73.5 kg)  04/15/18 164 lb 4 oz (74.5 kg)     Physical Exam  Constitutional: He appears well-developed and well-nourished.  HENT:  Head: Normocephalic and atraumatic.  Right Ear: Tympanic membrane and external ear normal. No decreased hearing is noted.  Left Ear: Tympanic membrane and external ear normal. No decreased hearing is noted.  Mouth/Throat: No oropharyngeal exudate or posterior oropharyngeal erythema.  Eyes: Pupils are equal, round, and reactive to light.  Neck: Normal range of motion. Neck supple.  Cardiovascular: Normal rate and regular rhythm.  No murmur heard. Pulmonary/Chest: Breath sounds normal. No respiratory distress.  Abdominal: Soft. Bowel sounds are  normal. He exhibits no mass. There is no tenderness.  Vitals reviewed.     Assessment & Plan:   Jose Hardin was seen today for medical management of chronic issues.  Diagnoses and all orders for this visit:  Essential hypertension  Gastroesophageal reflux disease with esophagitis  Other orders -     omeprazole (PRILOSEC) 40 MG capsule; Take 1 capsule (40 mg total) by mouth daily. -     alendronate (FOSAMAX) 70 MG tablet; TAKE 1  TABLET ONCE A WEEK AS DIRECTED .   Allergies as of 08/25/2018      Reactions   Namenda [memantine Hcl] Other (See Comments)   Somnolence   Pregabalin Other (See Comments)   Soap Rash      Medication List        Accurate as of 08/25/18 11:59 PM. Always use your most recent med list.          alendronate 70 MG tablet Commonly known as:  FOSAMAX TAKE 1 TABLET ONCE A WEEK AS DIRECTED .   amLODipine 5 MG tablet Commonly known as:  NORVASC Take by mouth.   aspirin 325 MG tablet Take 325 mg by mouth daily.   donepezil 5 MG tablet Commonly known as:  ARICEPT Take 1.5 tablets (7.5 mg total) by mouth at bedtime.   MAGNESIUM MALATE PO Take 1,350 mg by mouth 2 (two) times daily.   omeprazole 40 MG capsule Commonly known as:  PRILOSEC Take 1 capsule (40 mg total) by mouth daily.   traMADol 50 MG tablet Commonly known as:  ULTRAM Take one tablet twice a day   vitamin B-12 1000 MCG tablet Commonly known as:  CYANOCOBALAMIN Take 1,000 mcg by mouth daily.       Meds ordered this encounter  Medications  . omeprazole (PRILOSEC) 40 MG capsule    Sig: Take 1 capsule (40 mg total) by mouth daily.    Dispense:  90 capsule    Refill:  3  . alendronate (FOSAMAX) 70 MG tablet    Sig: TAKE 1 TABLET ONCE A WEEK AS DIRECTED .    Dispense:  13 tablet    Refill:  3      Follow-up: Return in about 6 months (around 02/25/2019).  Claretta Fraise, M.D.

## 2018-08-31 ENCOUNTER — Encounter: Payer: Self-pay | Admitting: Family Medicine

## 2018-09-21 ENCOUNTER — Other Ambulatory Visit: Payer: Self-pay | Admitting: Family Medicine

## 2018-09-25 ENCOUNTER — Ambulatory Visit: Payer: Medicare Other | Admitting: Family Medicine

## 2018-09-29 ENCOUNTER — Encounter: Payer: Self-pay | Admitting: Family Medicine

## 2018-09-29 ENCOUNTER — Ambulatory Visit (INDEPENDENT_AMBULATORY_CARE_PROVIDER_SITE_OTHER): Payer: Medicare Other | Admitting: Family Medicine

## 2018-09-29 VITALS — BP 152/67 | HR 60 | Temp 96.9°F | Ht 69.0 in | Wt 159.7 lb

## 2018-09-29 DIAGNOSIS — I10 Essential (primary) hypertension: Secondary | ICD-10-CM | POA: Diagnosis not present

## 2018-09-29 DIAGNOSIS — Z23 Encounter for immunization: Secondary | ICD-10-CM

## 2018-09-29 DIAGNOSIS — G3184 Mild cognitive impairment, so stated: Secondary | ICD-10-CM

## 2018-09-29 MED ORDER — DONEPEZIL HCL 5 MG PO TABS: 7.5000 mg | ORAL_TABLET | Freq: Every day | ORAL | 1 refills | Status: AC

## 2018-09-29 MED ORDER — AMLODIPINE BESYLATE 5 MG PO TABS: 5.0000 mg | ORAL_TABLET | Freq: Every day | ORAL | 1 refills | Status: AC

## 2018-09-29 NOTE — Progress Notes (Signed)
Subjective:  Patient ID: Jose Hardin, male    DOB: 1930-01-17  Age: 84 y.o. MRN: [id number]  CC: Medical Management of Chronic Issues   HPI Jose Hardin presents for  follow-up of hypertension. Patient has no history of headache chest pain or shortness of breath or recent cough. Patient also denies symptoms of TIA such as focal numbness or weakness. Patient denies side effects from medication. States taking it regularly.  He did have 2 more spells of dizziness shortly after he was here last time but since starting to drink Gatorade multiple bottles a day his symptoms have resolved.  Patient also is treated for Alzheimer's disease.  He has been doing well with that.  He does admit to being forgetful but he is lucid and conversant according to his daughter who gives part of the history as well today. History Jose Hardin has a past medical history of BPH (benign prostatic hyperplasia), Carotid bruit present, Colovesical fistula, GERD (gastroesophageal reflux disease), Heart murmur, Hyperlipidemia, Hypertension, Kidney stones, Osteoporosis, Prediabetes, Stroke ([place]) (11/26/2016), and Vertebral compression fracture ([place]).   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   His family history is not on file.He reports that he quit smoking about 36 years ago. He has never used smokeless tobacco. He reports that he does not drink alcohol or use drugs.  Current Outpatient Medications on File Prior to Visit  Medication Sig Dispense Refill  . alendronate (FOSAMAX) 70 MG tablet TAKE 1 TABLET ONCE A WEEK AS DIRECTED. 4 tablet 1  . aspirin 325 MG tablet Take 325 mg by mouth daily.    Jose Hardin Kitchen MAGNESIUM MALATE PO Take 1,350 mg by mouth 2 (two) times daily.    Jose Hardin Kitchen omeprazole (PRILOSEC) 40 MG capsule Take 1 capsule (40 mg total) by mouth daily. 90 capsule 3  . vitamin B-12 (CYANOCOBALAMIN) 1000  MCG tablet Take 1,000 mcg by mouth daily.     No current facility-administered medications on file prior to visit.     ROS Review of Systems  Constitutional: Negative for fever.  Respiratory: Negative for shortness of breath.   Cardiovascular: Negative for chest pain.  Musculoskeletal: Negative for arthralgias.  Skin: Negative for rash.    Objective:  BP (!) 152/67   Pulse 60   Temp (!) 96.9 F (36.1 C) (Oral)   Ht 5\' 9"  (1.753 m)   Wt 159 lb 11.2 oz (72.4 kg)   BMI 23.58 kg/m   BP Readings from Last 3 Encounters:  09/29/18 (!) 152/67  08/25/18 (!) 151/80  05/29/18 127/86    Wt Readings from Last 3 Encounters:  09/29/18 159 lb 11.2 oz (72.4 kg)  08/25/18 159 lb 8 oz (72.3 kg)  05/29/18 162 lb (73.5 kg)     Physical Exam  Constitutional: He is oriented to person, place, and time. He appears well-developed and well-nourished.  HENT:  Head: Normocephalic and atraumatic.  Right Ear: External ear normal.  Left Ear: External ear normal.  Mouth/Throat: No oropharyngeal exudate or posterior oropharyngeal erythema.  Eyes: Pupils are equal, round, and reactive to light.  Neck: Normal range of motion. Neck supple.  Cardiovascular: Normal rate and regular rhythm.  No murmur heard. Pulmonary/Chest: Breath sounds normal. No respiratory distress.  Neurological: He is alert and oriented to person, place, and time.  Vitals reviewed.     Assessment & Plan:   Jose Hardin was seen today  for medical management of chronic issues.  Diagnoses and all orders for this visit:  Essential hypertension  Encounter for immunization -     Flu vaccine HIGH DOSE PF  Mild cognitive impairment  Other orders -     amLODipine (NORVASC) 5 MG tablet; Take 1 tablet (5 mg total) by mouth daily. -     donepezil (ARICEPT) 5 MG tablet; Take 1.5 tablets (7.5 mg total) by mouth at bedtime.   Allergies as of 09/29/2018      Reactions   Namenda [memantine Hcl] Other (See Comments)   Somnolence    Pregabalin Other (See Comments)   Soap Rash      Medication List        Accurate as of 09/29/18  7:07 PM. Always use your most recent med list.          alendronate 70 MG tablet Commonly known as:  FOSAMAX TAKE 1 TABLET ONCE A WEEK AS DIRECTED.   amLODipine 5 MG tablet Commonly known as:  NORVASC Take 1 tablet (5 mg total) by mouth daily.   aspirin 325 MG tablet Take 325 mg by mouth daily.   donepezil 5 MG tablet Commonly known as:  ARICEPT Take 1.5 tablets (7.5 mg total) by mouth at bedtime.   MAGNESIUM MALATE PO Take 1,350 mg by mouth 2 (two) times daily.   omeprazole 40 MG capsule Commonly known as:  PRILOSEC Take 1 capsule (40 mg total) by mouth daily.   vitamin B-12 1000 MCG tablet Commonly known as:  CYANOCOBALAMIN Take 1,000 mcg by mouth daily.       Meds ordered this encounter  Medications  . amLODipine (NORVASC) 5 MG tablet    Sig: Take 1 tablet (5 mg total) by mouth daily.    Dispense:  90 tablet    Refill:  1  . donepezil (ARICEPT) 5 MG tablet    Sig: Take 1.5 tablets (7.5 mg total) by mouth at bedtime.    Dispense:  135 tablet    Refill:  1      Follow-up: Return in about 3 months (around 12/30/2018).  Claretta Fraise, M.D.

## 2018-10-15 ENCOUNTER — Telehealth: Payer: Self-pay | Admitting: Family Medicine

## 2018-10-15 NOTE — Telephone Encounter (Signed)
Appointment given for tomorrow with Dr. Livia Snellen.

## 2018-10-16 ENCOUNTER — Telehealth: Payer: Self-pay | Admitting: Family Medicine

## 2018-10-16 ENCOUNTER — Ambulatory Visit (INDEPENDENT_AMBULATORY_CARE_PROVIDER_SITE_OTHER): Payer: Medicare Other | Admitting: Family Medicine

## 2018-10-16 VITALS — BP 127/75 | HR 95 | Temp 97.1°F | Ht 69.0 in | Wt 156.0 lb

## 2018-10-16 DIAGNOSIS — I517 Cardiomegaly: Secondary | ICD-10-CM | POA: Diagnosis not present

## 2018-10-16 DIAGNOSIS — R410 Disorientation, unspecified: Secondary | ICD-10-CM | POA: Diagnosis not present

## 2018-10-16 DIAGNOSIS — Z7982 Long term (current) use of aspirin: Secondary | ICD-10-CM | POA: Diagnosis not present

## 2018-10-16 DIAGNOSIS — I639 Cerebral infarction, unspecified: Secondary | ICD-10-CM | POA: Diagnosis not present

## 2018-10-16 DIAGNOSIS — N3000 Acute cystitis without hematuria: Secondary | ICD-10-CM | POA: Diagnosis not present

## 2018-10-16 DIAGNOSIS — R4701 Aphasia: Secondary | ICD-10-CM | POA: Diagnosis not present

## 2018-10-16 DIAGNOSIS — I10 Essential (primary) hypertension: Secondary | ICD-10-CM | POA: Diagnosis not present

## 2018-10-16 DIAGNOSIS — Z87891 Personal history of nicotine dependence: Secondary | ICD-10-CM | POA: Diagnosis not present

## 2018-10-16 DIAGNOSIS — I083 Combined rheumatic disorders of mitral, aortic and tricuspid valves: Secondary | ICD-10-CM | POA: Diagnosis not present

## 2018-10-16 DIAGNOSIS — R9082 White matter disease, unspecified: Secondary | ICD-10-CM | POA: Diagnosis not present

## 2018-10-16 DIAGNOSIS — Z888 Allergy status to other drugs, medicaments and biological substances status: Secondary | ICD-10-CM | POA: Diagnosis not present

## 2018-10-16 DIAGNOSIS — G309 Alzheimer's disease, unspecified: Secondary | ICD-10-CM | POA: Diagnosis not present

## 2018-10-16 DIAGNOSIS — R200 Anesthesia of skin: Secondary | ICD-10-CM | POA: Diagnosis not present

## 2018-10-16 DIAGNOSIS — Z91048 Other nonmedicinal substance allergy status: Secondary | ICD-10-CM | POA: Diagnosis not present

## 2018-10-16 DIAGNOSIS — I6389 Other cerebral infarction: Secondary | ICD-10-CM | POA: Diagnosis not present

## 2018-10-16 DIAGNOSIS — E876 Hypokalemia: Secondary | ICD-10-CM | POA: Diagnosis not present

## 2018-10-16 DIAGNOSIS — I63512 Cerebral infarction due to unspecified occlusion or stenosis of left middle cerebral artery: Secondary | ICD-10-CM | POA: Diagnosis not present

## 2018-10-16 DIAGNOSIS — I63232 Cerebral infarction due to unspecified occlusion or stenosis of left carotid arteries: Secondary | ICD-10-CM | POA: Diagnosis not present

## 2018-10-16 DIAGNOSIS — Z7902 Long term (current) use of antithrombotics/antiplatelets: Secondary | ICD-10-CM | POA: Diagnosis not present

## 2018-10-16 DIAGNOSIS — G8191 Hemiplegia, unspecified affecting right dominant side: Secondary | ICD-10-CM | POA: Diagnosis not present

## 2018-10-16 DIAGNOSIS — I519 Heart disease, unspecified: Secondary | ICD-10-CM | POA: Diagnosis not present

## 2018-10-16 DIAGNOSIS — R29701 NIHSS score 1: Secondary | ICD-10-CM | POA: Diagnosis not present

## 2018-10-16 DIAGNOSIS — E7800 Pure hypercholesterolemia, unspecified: Secondary | ICD-10-CM | POA: Diagnosis not present

## 2018-10-16 DIAGNOSIS — R930 Abnormal findings on diagnostic imaging of skull and head, not elsewhere classified: Secondary | ICD-10-CM | POA: Diagnosis not present

## 2018-10-16 DIAGNOSIS — G936 Cerebral edema: Secondary | ICD-10-CM | POA: Diagnosis not present

## 2018-10-16 DIAGNOSIS — Z79899 Other long term (current) drug therapy: Secondary | ICD-10-CM | POA: Diagnosis not present

## 2018-10-16 DIAGNOSIS — R29702 NIHSS score 2: Secondary | ICD-10-CM | POA: Diagnosis not present

## 2018-10-16 DIAGNOSIS — I2510 Atherosclerotic heart disease of native coronary artery without angina pectoris: Secondary | ICD-10-CM | POA: Diagnosis not present

## 2018-10-16 DIAGNOSIS — Z8673 Personal history of transient ischemic attack (TIA), and cerebral infarction without residual deficits: Secondary | ICD-10-CM | POA: Diagnosis not present

## 2018-10-16 LAB — URINALYSIS, ROUTINE W REFLEX MICROSCOPIC
Bilirubin, UA: NEGATIVE
GLUCOSE, UA: NEGATIVE
Leukocytes, UA: NEGATIVE
NITRITE UA: NEGATIVE
Specific Gravity, UA: 1.025 (ref 1.005–1.030)
UUROB: 1 mg/dL (ref 0.2–1.0)
pH, UA: 5 (ref 5.0–7.5)

## 2018-10-16 LAB — MICROSCOPIC EXAMINATION
EPITHELIAL CELLS (NON RENAL): NONE SEEN /HPF (ref 0–10)
Renal Epithel, UA: NONE SEEN /hpf

## 2018-10-16 MED ORDER — HEPARIN SODIUM (PORCINE) 5000 UNIT/ML IJ SOLN
5000.00 | INTRAMUSCULAR | Status: DC
Start: 2018-10-19 — End: 2018-10-16

## 2018-10-16 MED ORDER — ACETAMINOPHEN 325 MG PO TABS
650.00 | ORAL_TABLET | ORAL | Status: DC
Start: ? — End: 2018-10-16

## 2018-10-16 MED ORDER — ASPIRIN 325 MG PO TABS
325.00 | ORAL_TABLET | ORAL | Status: DC
Start: 2018-10-17 — End: 2018-10-16

## 2018-10-16 MED ORDER — SODIUM CHLORIDE 0.9 % IV SOLN
75.00 | INTRAVENOUS | Status: DC
Start: ? — End: 2018-10-16

## 2018-10-16 MED ORDER — HYDRALAZINE HCL 20 MG/ML IJ SOLN
10.00 | INTRAMUSCULAR | Status: DC
Start: ? — End: 2018-10-16

## 2018-10-16 MED ORDER — LEVOFLOXACIN 500 MG PO TABS: 500.0000 mg | ORAL_TABLET | Freq: Every day | ORAL | 0 refills | Status: AC

## 2018-10-16 MED ORDER — LABETALOL HCL 5 MG/ML IV SOLN
10.00 | INTRAVENOUS | Status: DC
Start: ? — End: 2018-10-16

## 2018-10-16 MED ORDER — ONDANSETRON HCL 4 MG/2ML IJ SOLN
4.00 | INTRAMUSCULAR | Status: DC
Start: ? — End: 2018-10-16

## 2018-10-16 MED ORDER — ATORVASTATIN CALCIUM 20 MG PO TABS
40.00 | ORAL_TABLET | ORAL | Status: DC
Start: 2018-10-16 — End: 2018-10-16

## 2018-10-16 MED ORDER — CEFTRIAXONE SODIUM 1 G IJ SOLR
1.0000 g | Freq: Once | INTRAMUSCULAR | Status: AC
  Administered 2018-10-16: 1 g via INTRAMUSCULAR

## 2018-10-16 MED ORDER — GENERIC EXTERNAL MEDICATION
30.00 | Status: DC
Start: ? — End: 2018-10-16

## 2018-10-16 MED ORDER — DOCUSATE SODIUM 100 MG PO CAPS
100.00 | ORAL_CAPSULE | ORAL | Status: DC
Start: 2018-10-19 — End: 2018-10-16

## 2018-10-16 MED ORDER — SODIUM CHLORIDE 0.9 % IV SOLN
20.00 | INTRAVENOUS | Status: DC
Start: ? — End: 2018-10-16

## 2018-10-16 NOTE — Telephone Encounter (Signed)
For provider from patient's wife.

## 2018-10-16 NOTE — Progress Notes (Signed)
Mini Cog-repeated 3 words but did not remember any at 2 minutes.  Unable to label the clock correctly or to draw hands. Did not comprehend the instructions.

## 2018-10-16 NOTE — Progress Notes (Signed)
Subjective:  Patient ID: ANTONIO CRESWELL, male    DOB: [birth year]  Age: 84 y.o. MRN: [id number]  CC: Altered Mental Status (x 5 days. Has also been tearful because he knows something is wrong. He did have a moment where he didn't recognize his wife and daughter earlier. Has forgotten how do to very common things like how to eat with a fork and button his shirt )   HPI TREVYON SWOR presents for increased confusion with sudden onset 5 days ago. More confused. No focal weakness of face or extremity. Unable to communicate wel. Forgetful. Fumbling with simple tasks. He was able to do all of the above when he was seen by neurology about a week ago.  Depression screen [facility] 2/9 09/29/2018 03/18/2018 08/06/2017  Decreased Interest 0 1 0  Down, Depressed, Hopeless 0 0 0  PHQ - 2 Score 0 1 0    History Ronnald has a past medical history of BPH (benign prostatic hyperplasia), Carotid bruit present, Colovesical fistula, GERD (gastroesophageal reflux disease), Heart murmur, Hyperlipidemia, Hypertension, Kidney stones, Osteoporosis, Prediabetes, Stroke ([place]) (11/26/2016), and Vertebral compression fracture ([place]).   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   His family history is not on file.He reports that he quit smoking about 36 years ago. He has never used smokeless tobacco. He reports that he does not drink alcohol or use drugs.    ROS Review of Systems  Constitutional: Positive for activity change.  HENT: Negative.   Eyes: Negative for visual disturbance.  Respiratory: Negative for cough and shortness of breath.   Cardiovascular: Negative for chest pain and leg swelling.  Gastrointestinal: Negative for abdominal pain, diarrhea, nausea and vomiting.  Genitourinary: Negative for difficulty urinating.  Musculoskeletal: Negative for arthralgias and myalgias.  Skin:  Negative for rash.  Neurological: Negative for headaches.  Psychiatric/Behavioral: Positive for confusion and decreased concentration. Negative for behavioral problems, dysphoric mood and sleep disturbance.    Objective:  BP 127/75 (BP Location: Left Arm, Patient Position: Sitting, Cuff Size: Normal)   Pulse 95   Temp (!) 97.1 F (36.2 C) (Oral)   Ht 5\' 9"  (1.753 m)   Wt 156 lb (70.8 kg)   BMI 23.04 kg/m   BP Readings from Last 3 Encounters:  10/16/18 127/75  09/29/18 (!) 152/67  08/25/18 (!) 151/80    Wt Readings from Last 3 Encounters:  10/16/18 156 lb (70.8 kg)  09/29/18 159 lb 11.2 oz (72.4 kg)  08/25/18 159 lb 8 oz (72.3 kg)     Physical Exam  Constitutional: He appears well-developed and well-nourished. No distress.  HENT:  Head: Normocephalic and atraumatic.  Right Ear: External ear normal.  Left Ear: External ear normal.  Nose: Nose normal.  Mouth/Throat: Oropharynx is clear and moist.  Eyes: Pupils are equal, round, and reactive to light. Conjunctivae and EOM are normal.  Neck: Normal range of motion. Neck supple.  Cardiovascular: Normal rate, regular rhythm and normal heart sounds.  No murmur heard. Pulmonary/Chest: Effort normal and breath sounds normal. No respiratory distress. He has no wheezes. He has no rales.  Abdominal: Soft. There is no tenderness.  Musculoskeletal: Normal range of motion.  Neurological: He is alert. He has normal reflexes. No cranial nerve deficit or sensory deficit. He exhibits normal muscle tone. Coordination abnormal.  Skin: Skin is warm and dry.  Psychiatric: He has a normal mood  and affect. His behavior is normal. Judgment and thought content normal.   Pt could not button his shirt. Right hand lacking coordination to zip trousers after exam.   Assessment & Plan:   Octavion was seen today for altered mental status.  Diagnoses and all orders for this visit:  Confusion -     Urinalysis, Routine w reflex microscopic -      Urine Culture -     cefTRIAXone (ROCEPHIN) injection 1 g  Other orders -     levofloxacin (LEVAQUIN) 500 MG tablet; Take 1 tablet (500 mg total) by mouth daily. For 10 days -     Microscopic Examination       I am having Nikki Dom. Bakula start on levofloxacin. I am also having him maintain his aspirin, vitamin B-12, MAGNESIUM MALATE PO, omeprazole, alendronate, amLODipine, and donepezil. We administered cefTRIAXone.  Allergies as of 10/16/2018      Reactions   Namenda [memantine Hcl] Other (See Comments)   Somnolence   Pregabalin Other (See Comments)   Soap Rash      Medication List        Accurate as of 10/16/18 11:59 PM. Always use your most recent med list.          alendronate 70 MG tablet Commonly known as:  FOSAMAX TAKE 1 TABLET ONCE A WEEK AS DIRECTED.   amLODipine 5 MG tablet Commonly known as:  NORVASC Take 1 tablet (5 mg total) by mouth daily.   aspirin 325 MG tablet Take 325 mg by mouth daily.   donepezil 5 MG tablet Commonly known as:  ARICEPT Take 1.5 tablets (7.5 mg total) by mouth at bedtime.   levofloxacin 500 MG tablet Commonly known as:  LEVAQUIN Take 1 tablet (500 mg total) by mouth daily. For 10 days   MAGNESIUM MALATE PO Take 1,350 mg by mouth 2 (two) times daily.   omeprazole 40 MG capsule Commonly known as:  PRILOSEC Take 1 capsule (40 mg total) by mouth daily.   vitamin B-12 1000 MCG tablet Commonly known as:  CYANOCOBALAMIN Take 1,000 mcg by mouth daily.      Pt transferred to [facility] by private car with family for possible stroke.  Follow-up: Return in about 2 weeks (around 10/30/2018).  Claretta Fraise, M.D.

## 2018-10-17 ENCOUNTER — Encounter: Payer: Self-pay | Admitting: Family Medicine

## 2018-10-17 LAB — URINE CULTURE

## 2018-10-20 ENCOUNTER — Telehealth: Payer: Self-pay

## 2018-10-20 DIAGNOSIS — Z87891 Personal history of nicotine dependence: Secondary | ICD-10-CM | POA: Diagnosis not present

## 2018-10-20 DIAGNOSIS — I69351 Hemiplegia and hemiparesis following cerebral infarction affecting right dominant side: Secondary | ICD-10-CM | POA: Diagnosis not present

## 2018-10-20 DIAGNOSIS — N390 Urinary tract infection, site not specified: Secondary | ICD-10-CM | POA: Diagnosis not present

## 2018-10-20 DIAGNOSIS — K219 Gastro-esophageal reflux disease without esophagitis: Secondary | ICD-10-CM | POA: Diagnosis not present

## 2018-10-20 DIAGNOSIS — E7849 Other hyperlipidemia: Secondary | ICD-10-CM | POA: Diagnosis not present

## 2018-10-20 DIAGNOSIS — N139 Obstructive and reflux uropathy, unspecified: Secondary | ICD-10-CM | POA: Diagnosis not present

## 2018-10-20 DIAGNOSIS — I129 Hypertensive chronic kidney disease with stage 1 through stage 4 chronic kidney disease, or unspecified chronic kidney disease: Secondary | ICD-10-CM | POA: Diagnosis not present

## 2018-10-20 DIAGNOSIS — G309 Alzheimer's disease, unspecified: Secondary | ICD-10-CM | POA: Diagnosis not present

## 2018-10-20 DIAGNOSIS — Z7982 Long term (current) use of aspirin: Secondary | ICD-10-CM | POA: Diagnosis not present

## 2018-10-20 DIAGNOSIS — N183 Chronic kidney disease, stage 3 (moderate): Secondary | ICD-10-CM | POA: Diagnosis not present

## 2018-10-20 DIAGNOSIS — R42 Dizziness and giddiness: Secondary | ICD-10-CM | POA: Diagnosis not present

## 2018-10-20 NOTE — Telephone Encounter (Signed)
Clarify two meds   In hospital given Omeprazole 20 mg  At home he has Omeprazole 40 mg   Also should he be taking Magnesium?

## 2018-10-20 NOTE — Telephone Encounter (Signed)
Please take magnesium as noted on med list. Omeprazole should be 40 mg daily.

## 2018-10-21 DIAGNOSIS — H5340 Unspecified visual field defects: Secondary | ICD-10-CM | POA: Diagnosis not present

## 2018-10-21 DIAGNOSIS — N189 Chronic kidney disease, unspecified: Secondary | ICD-10-CM | POA: Diagnosis not present

## 2018-10-21 DIAGNOSIS — N390 Urinary tract infection, site not specified: Secondary | ICD-10-CM | POA: Diagnosis not present

## 2018-10-21 DIAGNOSIS — R4701 Aphasia: Secondary | ICD-10-CM | POA: Diagnosis not present

## 2018-10-21 DIAGNOSIS — K219 Gastro-esophageal reflux disease without esophagitis: Secondary | ICD-10-CM | POA: Diagnosis not present

## 2018-10-21 DIAGNOSIS — M1990 Unspecified osteoarthritis, unspecified site: Secondary | ICD-10-CM | POA: Diagnosis not present

## 2018-10-21 DIAGNOSIS — J929 Pleural plaque without asbestos: Secondary | ICD-10-CM | POA: Diagnosis not present

## 2018-10-21 DIAGNOSIS — E7849 Other hyperlipidemia: Secondary | ICD-10-CM | POA: Diagnosis not present

## 2018-10-21 DIAGNOSIS — N138 Other obstructive and reflux uropathy: Secondary | ICD-10-CM | POA: Diagnosis not present

## 2018-10-21 DIAGNOSIS — R918 Other nonspecific abnormal finding of lung field: Secondary | ICD-10-CM | POA: Diagnosis not present

## 2018-10-21 DIAGNOSIS — Z888 Allergy status to other drugs, medicaments and biological substances status: Secondary | ICD-10-CM | POA: Diagnosis not present

## 2018-10-21 DIAGNOSIS — N183 Chronic kidney disease, stage 3 (moderate): Secondary | ICD-10-CM | POA: Diagnosis not present

## 2018-10-21 DIAGNOSIS — E785 Hyperlipidemia, unspecified: Secondary | ICD-10-CM | POA: Diagnosis not present

## 2018-10-21 DIAGNOSIS — R29818 Other symptoms and signs involving the nervous system: Secondary | ICD-10-CM | POA: Diagnosis not present

## 2018-10-21 DIAGNOSIS — G308 Other Alzheimer's disease: Secondary | ICD-10-CM | POA: Diagnosis not present

## 2018-10-21 DIAGNOSIS — J90 Pleural effusion, not elsewhere classified: Secondary | ICD-10-CM | POA: Diagnosis not present

## 2018-10-21 DIAGNOSIS — R932 Abnormal findings on diagnostic imaging of liver and biliary tract: Secondary | ICD-10-CM | POA: Diagnosis not present

## 2018-10-21 DIAGNOSIS — I69351 Hemiplegia and hemiparesis following cerebral infarction affecting right dominant side: Secondary | ICD-10-CM | POA: Diagnosis not present

## 2018-10-21 DIAGNOSIS — R42 Dizziness and giddiness: Secondary | ICD-10-CM | POA: Diagnosis not present

## 2018-10-21 DIAGNOSIS — Z8673 Personal history of transient ischemic attack (TIA), and cerebral infarction without residual deficits: Secondary | ICD-10-CM | POA: Diagnosis not present

## 2018-10-21 DIAGNOSIS — I10 Essential (primary) hypertension: Secondary | ICD-10-CM | POA: Diagnosis not present

## 2018-10-21 DIAGNOSIS — I639 Cerebral infarction, unspecified: Secondary | ICD-10-CM | POA: Diagnosis not present

## 2018-10-21 DIAGNOSIS — E7800 Pure hypercholesterolemia, unspecified: Secondary | ICD-10-CM | POA: Diagnosis not present

## 2018-10-21 DIAGNOSIS — I69320 Aphasia following cerebral infarction: Secondary | ICD-10-CM | POA: Diagnosis not present

## 2018-10-21 DIAGNOSIS — Z7982 Long term (current) use of aspirin: Secondary | ICD-10-CM | POA: Diagnosis not present

## 2018-10-21 DIAGNOSIS — I6389 Other cerebral infarction: Secondary | ICD-10-CM | POA: Diagnosis not present

## 2018-10-21 DIAGNOSIS — I129 Hypertensive chronic kidney disease with stage 1 through stage 4 chronic kidney disease, or unspecified chronic kidney disease: Secondary | ICD-10-CM | POA: Diagnosis not present

## 2018-10-21 DIAGNOSIS — Z87891 Personal history of nicotine dependence: Secondary | ICD-10-CM | POA: Diagnosis not present

## 2018-10-21 DIAGNOSIS — G309 Alzheimer's disease, unspecified: Secondary | ICD-10-CM | POA: Diagnosis not present

## 2018-10-21 DIAGNOSIS — N179 Acute kidney failure, unspecified: Secondary | ICD-10-CM | POA: Diagnosis not present

## 2018-10-21 DIAGNOSIS — Z9049 Acquired absence of other specified parts of digestive tract: Secondary | ICD-10-CM | POA: Diagnosis not present

## 2018-10-21 DIAGNOSIS — I2510 Atherosclerotic heart disease of native coronary artery without angina pectoris: Secondary | ICD-10-CM | POA: Diagnosis not present

## 2018-10-21 DIAGNOSIS — G8191 Hemiplegia, unspecified affecting right dominant side: Secondary | ICD-10-CM | POA: Diagnosis not present

## 2018-10-21 DIAGNOSIS — N139 Obstructive and reflux uropathy, unspecified: Secondary | ICD-10-CM | POA: Diagnosis not present

## 2018-10-21 DIAGNOSIS — R29703 NIHSS score 3: Secondary | ICD-10-CM | POA: Diagnosis not present

## 2018-10-21 DIAGNOSIS — I63512 Cerebral infarction due to unspecified occlusion or stenosis of left middle cerebral artery: Secondary | ICD-10-CM | POA: Diagnosis not present

## 2018-10-21 MED ORDER — AMLODIPINE BESYLATE 5 MG PO TABS
5.00 | ORAL_TABLET | ORAL | Status: DC
Start: 2018-10-20 — End: 2018-10-21

## 2018-10-21 MED ORDER — SODIUM CHLORIDE 0.9 % IV SOLN
10.00 | INTRAVENOUS | Status: DC
Start: ? — End: 2018-10-21

## 2018-10-21 MED ORDER — DONEPEZIL HCL 5 MG PO TABS
5.00 | ORAL_TABLET | ORAL | Status: DC
Start: 2018-10-19 — End: 2018-10-21

## 2018-10-21 MED ORDER — PRAVASTATIN SODIUM 40 MG PO TABS
40.00 | ORAL_TABLET | ORAL | Status: DC
Start: ? — End: 2018-10-21

## 2018-10-21 MED ORDER — GENERIC EXTERNAL MEDICATION
10.00 | Status: DC
Start: ? — End: 2018-10-21

## 2018-10-21 MED ORDER — ASPIRIN 81 MG PO CHEW
81.00 | CHEWABLE_TABLET | ORAL | Status: DC
Start: 2018-10-20 — End: 2018-10-21

## 2018-10-21 MED ORDER — CLOPIDOGREL BISULFATE 75 MG PO TABS
75.00 | ORAL_TABLET | ORAL | Status: DC
Start: 2018-10-20 — End: 2018-10-21

## 2018-10-21 MED ORDER — PANTOPRAZOLE SODIUM 20 MG PO TBEC
20.00 | DELAYED_RELEASE_TABLET | ORAL | Status: DC
Start: 2018-10-20 — End: 2018-10-21

## 2018-10-21 NOTE — Telephone Encounter (Signed)
lmtcb

## 2018-10-22 NOTE — Telephone Encounter (Signed)
Patient is back in hospital, per conversation with family.

## 2018-10-23 ENCOUNTER — Telehealth: Payer: Self-pay | Admitting: Family Medicine

## 2018-10-26 ENCOUNTER — Ambulatory Visit: Payer: Medicare Other | Admitting: Family Medicine

## 2018-10-26 DIAGNOSIS — N189 Chronic kidney disease, unspecified: Secondary | ICD-10-CM | POA: Diagnosis not present

## 2018-10-26 DIAGNOSIS — I69351 Hemiplegia and hemiparesis following cerebral infarction affecting right dominant side: Secondary | ICD-10-CM | POA: Diagnosis not present

## 2018-10-26 DIAGNOSIS — I63232 Cerebral infarction due to unspecified occlusion or stenosis of left carotid arteries: Secondary | ICD-10-CM | POA: Diagnosis not present

## 2018-10-26 DIAGNOSIS — I639 Cerebral infarction, unspecified: Secondary | ICD-10-CM | POA: Diagnosis not present

## 2018-10-26 DIAGNOSIS — I10 Essential (primary) hypertension: Secondary | ICD-10-CM | POA: Diagnosis not present

## 2018-10-26 DIAGNOSIS — N3000 Acute cystitis without hematuria: Secondary | ICD-10-CM | POA: Diagnosis not present

## 2018-10-26 DIAGNOSIS — I69320 Aphasia following cerebral infarction: Secondary | ICD-10-CM | POA: Diagnosis not present

## 2018-10-26 DIAGNOSIS — I129 Hypertensive chronic kidney disease with stage 1 through stage 4 chronic kidney disease, or unspecified chronic kidney disease: Secondary | ICD-10-CM | POA: Diagnosis not present

## 2018-10-26 DIAGNOSIS — G309 Alzheimer's disease, unspecified: Secondary | ICD-10-CM | POA: Diagnosis not present

## 2018-10-26 DIAGNOSIS — I63512 Cerebral infarction due to unspecified occlusion or stenosis of left middle cerebral artery: Secondary | ICD-10-CM | POA: Diagnosis not present

## 2018-10-26 DIAGNOSIS — E876 Hypokalemia: Secondary | ICD-10-CM | POA: Diagnosis not present

## 2018-10-26 DIAGNOSIS — E785 Hyperlipidemia, unspecified: Secondary | ICD-10-CM | POA: Diagnosis not present

## 2018-10-28 DIAGNOSIS — Z029 Encounter for administrative examinations, unspecified: Secondary | ICD-10-CM

## 2018-10-28 MED ORDER — CALCIUM CARBONATE ANTACID 500 MG PO CHEW
1000.00 | CHEWABLE_TABLET | ORAL | Status: DC
Start: ? — End: 2018-10-28

## 2018-10-28 MED ORDER — ACETAMINOPHEN 650 MG RE SUPP
650.00 | RECTAL | Status: DC
Start: ? — End: 2018-10-28

## 2018-10-28 MED ORDER — HEPARIN SODIUM (PORCINE) 5000 UNIT/ML IJ SOLN
5000.00 | INTRAMUSCULAR | Status: DC
Start: 2018-10-26 — End: 2018-10-28

## 2018-10-28 MED ORDER — PANTOPRAZOLE SODIUM 20 MG PO TBEC
20.00 | DELAYED_RELEASE_TABLET | ORAL | Status: DC
Start: 2018-10-27 — End: 2018-10-28

## 2018-10-28 MED ORDER — CLOPIDOGREL BISULFATE 75 MG PO TABS
75.00 | ORAL_TABLET | ORAL | Status: DC
Start: 2018-10-27 — End: 2018-10-28

## 2018-10-28 MED ORDER — GENERIC EXTERNAL MEDICATION
10.00 | Status: DC
Start: ? — End: 2018-10-28

## 2018-10-28 MED ORDER — DONEPEZIL HCL 5 MG PO TABS
5.00 | ORAL_TABLET | ORAL | Status: DC
Start: 2018-10-26 — End: 2018-10-28

## 2018-10-28 MED ORDER — ALUM & MAG HYDROXIDE-SIMETH 200-200-20 MG/5ML PO SUSP
30.00 | ORAL | Status: DC
Start: ? — End: 2018-10-28

## 2018-10-28 MED ORDER — DOCUSATE SODIUM 100 MG PO CAPS
100.00 | ORAL_CAPSULE | ORAL | Status: DC
Start: 2018-10-26 — End: 2018-10-28

## 2018-10-28 MED ORDER — PRAVASTATIN SODIUM 40 MG PO TABS
40.00 | ORAL_TABLET | ORAL | Status: DC
Start: 2018-10-26 — End: 2018-10-28

## 2018-10-28 MED ORDER — ONDANSETRON HCL 4 MG/2ML IJ SOLN
4.00 | INTRAMUSCULAR | Status: DC
Start: ? — End: 2018-10-28

## 2018-10-28 MED ORDER — AMLODIPINE BESYLATE 5 MG PO TABS
5.00 | ORAL_TABLET | ORAL | Status: DC
Start: 2018-10-27 — End: 2018-10-28

## 2018-10-28 MED ORDER — SODIUM CHLORIDE 0.9 % IV SOLN
10.00 | INTRAVENOUS | Status: DC
Start: ? — End: 2018-10-28

## 2018-10-28 MED ORDER — QUETIAPINE FUMARATE 25 MG PO TABS
12.50 | ORAL_TABLET | ORAL | Status: DC
Start: 2018-10-26 — End: 2018-10-28

## 2018-10-28 MED ORDER — ACETAMINOPHEN 325 MG PO TABS
650.00 | ORAL_TABLET | ORAL | Status: DC
Start: ? — End: 2018-10-28

## 2018-10-28 MED ORDER — ESCITALOPRAM OXALATE 10 MG PO TABS
10.00 | ORAL_TABLET | ORAL | Status: DC
Start: 2018-10-27 — End: 2018-10-28

## 2018-10-28 MED ORDER — LABETALOL HCL 5 MG/ML IV SOLN
10.00 | INTRAVENOUS | Status: DC
Start: ? — End: 2018-10-28

## 2018-10-28 MED ORDER — ASPIRIN 325 MG PO TABS
325.00 | ORAL_TABLET | ORAL | Status: DC
Start: 2018-10-26 — End: 2018-10-28

## 2018-11-10 DIAGNOSIS — G309 Alzheimer's disease, unspecified: Secondary | ICD-10-CM | POA: Diagnosis not present

## 2018-11-10 DIAGNOSIS — R4701 Aphasia: Secondary | ICD-10-CM | POA: Diagnosis not present

## 2018-11-10 DIAGNOSIS — I10 Essential (primary) hypertension: Secondary | ICD-10-CM | POA: Diagnosis not present

## 2018-11-10 DIAGNOSIS — I69351 Hemiplegia and hemiparesis following cerebral infarction affecting right dominant side: Secondary | ICD-10-CM | POA: Diagnosis not present

## 2018-11-11 ENCOUNTER — Ambulatory Visit (INDEPENDENT_AMBULATORY_CARE_PROVIDER_SITE_OTHER): Payer: Medicare Other

## 2018-11-11 DIAGNOSIS — F0280 Dementia in other diseases classified elsewhere without behavioral disturbance: Secondary | ICD-10-CM | POA: Diagnosis not present

## 2018-11-11 DIAGNOSIS — H9190 Unspecified hearing loss, unspecified ear: Secondary | ICD-10-CM | POA: Diagnosis not present

## 2018-11-11 DIAGNOSIS — N139 Obstructive and reflux uropathy, unspecified: Secondary | ICD-10-CM

## 2018-11-11 DIAGNOSIS — K219 Gastro-esophageal reflux disease without esophagitis: Secondary | ICD-10-CM | POA: Diagnosis not present

## 2018-11-11 DIAGNOSIS — M6281 Muscle weakness (generalized): Secondary | ICD-10-CM | POA: Diagnosis not present

## 2018-11-11 DIAGNOSIS — Z87891 Personal history of nicotine dependence: Secondary | ICD-10-CM | POA: Diagnosis not present

## 2018-11-11 DIAGNOSIS — Z7982 Long term (current) use of aspirin: Secondary | ICD-10-CM

## 2018-11-11 DIAGNOSIS — I69351 Hemiplegia and hemiparesis following cerebral infarction affecting right dominant side: Secondary | ICD-10-CM

## 2018-11-11 DIAGNOSIS — I69320 Aphasia following cerebral infarction: Secondary | ICD-10-CM | POA: Diagnosis not present

## 2018-11-11 DIAGNOSIS — Z9181 History of falling: Secondary | ICD-10-CM | POA: Diagnosis not present

## 2018-11-11 DIAGNOSIS — N189 Chronic kidney disease, unspecified: Secondary | ICD-10-CM | POA: Diagnosis not present

## 2018-11-11 DIAGNOSIS — I69398 Other sequelae of cerebral infarction: Secondary | ICD-10-CM | POA: Diagnosis not present

## 2018-11-11 DIAGNOSIS — Z7902 Long term (current) use of antithrombotics/antiplatelets: Secondary | ICD-10-CM | POA: Diagnosis not present

## 2018-11-11 DIAGNOSIS — R279 Unspecified lack of coordination: Secondary | ICD-10-CM | POA: Diagnosis not present

## 2018-11-11 DIAGNOSIS — N390 Urinary tract infection, site not specified: Secondary | ICD-10-CM | POA: Diagnosis not present

## 2018-11-11 DIAGNOSIS — E7849 Other hyperlipidemia: Secondary | ICD-10-CM

## 2018-11-11 DIAGNOSIS — G309 Alzheimer's disease, unspecified: Secondary | ICD-10-CM

## 2018-11-11 DIAGNOSIS — Z87442 Personal history of urinary calculi: Secondary | ICD-10-CM | POA: Diagnosis not present

## 2018-11-11 DIAGNOSIS — I129 Hypertensive chronic kidney disease with stage 1 through stage 4 chronic kidney disease, or unspecified chronic kidney disease: Secondary | ICD-10-CM | POA: Diagnosis not present

## 2018-11-11 DIAGNOSIS — R42 Dizziness and giddiness: Secondary | ICD-10-CM

## 2018-11-11 DIAGNOSIS — N183 Chronic kidney disease, stage 3 (moderate): Secondary | ICD-10-CM

## 2018-11-11 DIAGNOSIS — E785 Hyperlipidemia, unspecified: Secondary | ICD-10-CM | POA: Diagnosis not present

## 2018-11-11 DIAGNOSIS — I951 Orthostatic hypotension: Secondary | ICD-10-CM | POA: Diagnosis not present

## 2018-11-11 DIAGNOSIS — N401 Enlarged prostate with lower urinary tract symptoms: Secondary | ICD-10-CM

## 2018-11-11 DIAGNOSIS — M1990 Unspecified osteoarthritis, unspecified site: Secondary | ICD-10-CM | POA: Diagnosis not present

## 2018-11-11 DIAGNOSIS — H53421 Scotoma of blind spot area, right eye: Secondary | ICD-10-CM | POA: Diagnosis not present

## 2018-11-12 DIAGNOSIS — N189 Chronic kidney disease, unspecified: Secondary | ICD-10-CM | POA: Diagnosis not present

## 2018-11-12 DIAGNOSIS — G309 Alzheimer's disease, unspecified: Secondary | ICD-10-CM | POA: Diagnosis not present

## 2018-11-12 DIAGNOSIS — I69320 Aphasia following cerebral infarction: Secondary | ICD-10-CM | POA: Diagnosis not present

## 2018-11-12 DIAGNOSIS — R279 Unspecified lack of coordination: Secondary | ICD-10-CM | POA: Diagnosis not present

## 2018-11-12 DIAGNOSIS — H53421 Scotoma of blind spot area, right eye: Secondary | ICD-10-CM | POA: Diagnosis not present

## 2018-11-12 DIAGNOSIS — K219 Gastro-esophageal reflux disease without esophagitis: Secondary | ICD-10-CM | POA: Diagnosis not present

## 2018-11-12 DIAGNOSIS — Z7982 Long term (current) use of aspirin: Secondary | ICD-10-CM | POA: Diagnosis not present

## 2018-11-12 DIAGNOSIS — M1990 Unspecified osteoarthritis, unspecified site: Secondary | ICD-10-CM | POA: Diagnosis not present

## 2018-11-12 DIAGNOSIS — Z87891 Personal history of nicotine dependence: Secondary | ICD-10-CM | POA: Diagnosis not present

## 2018-11-12 DIAGNOSIS — I129 Hypertensive chronic kidney disease with stage 1 through stage 4 chronic kidney disease, or unspecified chronic kidney disease: Secondary | ICD-10-CM | POA: Diagnosis not present

## 2018-11-12 DIAGNOSIS — Z9181 History of falling: Secondary | ICD-10-CM | POA: Diagnosis not present

## 2018-11-12 DIAGNOSIS — H9190 Unspecified hearing loss, unspecified ear: Secondary | ICD-10-CM | POA: Diagnosis not present

## 2018-11-12 DIAGNOSIS — M6281 Muscle weakness (generalized): Secondary | ICD-10-CM | POA: Diagnosis not present

## 2018-11-12 DIAGNOSIS — I69398 Other sequelae of cerebral infarction: Secondary | ICD-10-CM | POA: Diagnosis not present

## 2018-11-12 DIAGNOSIS — I951 Orthostatic hypotension: Secondary | ICD-10-CM | POA: Diagnosis not present

## 2018-11-12 DIAGNOSIS — E785 Hyperlipidemia, unspecified: Secondary | ICD-10-CM | POA: Diagnosis not present

## 2018-11-12 DIAGNOSIS — Z7902 Long term (current) use of antithrombotics/antiplatelets: Secondary | ICD-10-CM | POA: Diagnosis not present

## 2018-11-12 DIAGNOSIS — Z87442 Personal history of urinary calculi: Secondary | ICD-10-CM | POA: Diagnosis not present

## 2018-11-16 DIAGNOSIS — M1990 Unspecified osteoarthritis, unspecified site: Secondary | ICD-10-CM | POA: Diagnosis not present

## 2018-11-16 DIAGNOSIS — I951 Orthostatic hypotension: Secondary | ICD-10-CM | POA: Diagnosis not present

## 2018-11-16 DIAGNOSIS — N189 Chronic kidney disease, unspecified: Secondary | ICD-10-CM | POA: Diagnosis not present

## 2018-11-16 DIAGNOSIS — Z87442 Personal history of urinary calculi: Secondary | ICD-10-CM | POA: Diagnosis not present

## 2018-11-16 DIAGNOSIS — E785 Hyperlipidemia, unspecified: Secondary | ICD-10-CM | POA: Diagnosis not present

## 2018-11-16 DIAGNOSIS — I69398 Other sequelae of cerebral infarction: Secondary | ICD-10-CM | POA: Diagnosis not present

## 2018-11-16 DIAGNOSIS — H9190 Unspecified hearing loss, unspecified ear: Secondary | ICD-10-CM | POA: Diagnosis not present

## 2018-11-16 DIAGNOSIS — H53421 Scotoma of blind spot area, right eye: Secondary | ICD-10-CM | POA: Diagnosis not present

## 2018-11-16 DIAGNOSIS — I129 Hypertensive chronic kidney disease with stage 1 through stage 4 chronic kidney disease, or unspecified chronic kidney disease: Secondary | ICD-10-CM | POA: Diagnosis not present

## 2018-11-16 DIAGNOSIS — R279 Unspecified lack of coordination: Secondary | ICD-10-CM | POA: Diagnosis not present

## 2018-11-16 DIAGNOSIS — Z87891 Personal history of nicotine dependence: Secondary | ICD-10-CM | POA: Diagnosis not present

## 2018-11-16 DIAGNOSIS — G309 Alzheimer's disease, unspecified: Secondary | ICD-10-CM | POA: Diagnosis not present

## 2018-11-16 DIAGNOSIS — M6281 Muscle weakness (generalized): Secondary | ICD-10-CM | POA: Diagnosis not present

## 2018-11-16 DIAGNOSIS — Z7902 Long term (current) use of antithrombotics/antiplatelets: Secondary | ICD-10-CM | POA: Diagnosis not present

## 2018-11-16 DIAGNOSIS — K219 Gastro-esophageal reflux disease without esophagitis: Secondary | ICD-10-CM | POA: Diagnosis not present

## 2018-11-16 DIAGNOSIS — Z9181 History of falling: Secondary | ICD-10-CM | POA: Diagnosis not present

## 2018-11-16 DIAGNOSIS — I69320 Aphasia following cerebral infarction: Secondary | ICD-10-CM | POA: Diagnosis not present

## 2018-11-16 DIAGNOSIS — Z7982 Long term (current) use of aspirin: Secondary | ICD-10-CM | POA: Diagnosis not present

## 2018-11-17 DIAGNOSIS — Z87442 Personal history of urinary calculi: Secondary | ICD-10-CM | POA: Diagnosis not present

## 2018-11-17 DIAGNOSIS — R279 Unspecified lack of coordination: Secondary | ICD-10-CM | POA: Diagnosis not present

## 2018-11-17 DIAGNOSIS — H53421 Scotoma of blind spot area, right eye: Secondary | ICD-10-CM | POA: Diagnosis not present

## 2018-11-17 DIAGNOSIS — N189 Chronic kidney disease, unspecified: Secondary | ICD-10-CM | POA: Diagnosis not present

## 2018-11-17 DIAGNOSIS — I69398 Other sequelae of cerebral infarction: Secondary | ICD-10-CM | POA: Diagnosis not present

## 2018-11-17 DIAGNOSIS — Z7982 Long term (current) use of aspirin: Secondary | ICD-10-CM | POA: Diagnosis not present

## 2018-11-17 DIAGNOSIS — I951 Orthostatic hypotension: Secondary | ICD-10-CM | POA: Diagnosis not present

## 2018-11-17 DIAGNOSIS — G309 Alzheimer's disease, unspecified: Secondary | ICD-10-CM | POA: Diagnosis not present

## 2018-11-17 DIAGNOSIS — Z9181 History of falling: Secondary | ICD-10-CM | POA: Diagnosis not present

## 2018-11-17 DIAGNOSIS — Z7902 Long term (current) use of antithrombotics/antiplatelets: Secondary | ICD-10-CM | POA: Diagnosis not present

## 2018-11-17 DIAGNOSIS — M6281 Muscle weakness (generalized): Secondary | ICD-10-CM | POA: Diagnosis not present

## 2018-11-17 DIAGNOSIS — K219 Gastro-esophageal reflux disease without esophagitis: Secondary | ICD-10-CM | POA: Diagnosis not present

## 2018-11-17 DIAGNOSIS — H9190 Unspecified hearing loss, unspecified ear: Secondary | ICD-10-CM | POA: Diagnosis not present

## 2018-11-17 DIAGNOSIS — Z87891 Personal history of nicotine dependence: Secondary | ICD-10-CM | POA: Diagnosis not present

## 2018-11-17 DIAGNOSIS — I69320 Aphasia following cerebral infarction: Secondary | ICD-10-CM | POA: Diagnosis not present

## 2018-11-17 DIAGNOSIS — M1990 Unspecified osteoarthritis, unspecified site: Secondary | ICD-10-CM | POA: Diagnosis not present

## 2018-11-17 DIAGNOSIS — I129 Hypertensive chronic kidney disease with stage 1 through stage 4 chronic kidney disease, or unspecified chronic kidney disease: Secondary | ICD-10-CM | POA: Diagnosis not present

## 2018-11-17 DIAGNOSIS — E785 Hyperlipidemia, unspecified: Secondary | ICD-10-CM | POA: Diagnosis not present

## 2018-11-19 DIAGNOSIS — Z87442 Personal history of urinary calculi: Secondary | ICD-10-CM | POA: Diagnosis not present

## 2018-11-19 DIAGNOSIS — Z7982 Long term (current) use of aspirin: Secondary | ICD-10-CM | POA: Diagnosis not present

## 2018-11-19 DIAGNOSIS — I69398 Other sequelae of cerebral infarction: Secondary | ICD-10-CM | POA: Diagnosis not present

## 2018-11-19 DIAGNOSIS — R279 Unspecified lack of coordination: Secondary | ICD-10-CM | POA: Diagnosis not present

## 2018-11-19 DIAGNOSIS — I69320 Aphasia following cerebral infarction: Secondary | ICD-10-CM | POA: Diagnosis not present

## 2018-11-19 DIAGNOSIS — K219 Gastro-esophageal reflux disease without esophagitis: Secondary | ICD-10-CM | POA: Diagnosis not present

## 2018-11-19 DIAGNOSIS — I129 Hypertensive chronic kidney disease with stage 1 through stage 4 chronic kidney disease, or unspecified chronic kidney disease: Secondary | ICD-10-CM | POA: Diagnosis not present

## 2018-11-19 DIAGNOSIS — M6281 Muscle weakness (generalized): Secondary | ICD-10-CM | POA: Diagnosis not present

## 2018-11-19 DIAGNOSIS — G309 Alzheimer's disease, unspecified: Secondary | ICD-10-CM | POA: Diagnosis not present

## 2018-11-19 DIAGNOSIS — H9190 Unspecified hearing loss, unspecified ear: Secondary | ICD-10-CM | POA: Diagnosis not present

## 2018-11-19 DIAGNOSIS — M1990 Unspecified osteoarthritis, unspecified site: Secondary | ICD-10-CM | POA: Diagnosis not present

## 2018-11-19 DIAGNOSIS — Z9181 History of falling: Secondary | ICD-10-CM | POA: Diagnosis not present

## 2018-11-19 DIAGNOSIS — N189 Chronic kidney disease, unspecified: Secondary | ICD-10-CM | POA: Diagnosis not present

## 2018-11-19 DIAGNOSIS — E785 Hyperlipidemia, unspecified: Secondary | ICD-10-CM | POA: Diagnosis not present

## 2018-11-19 DIAGNOSIS — H53421 Scotoma of blind spot area, right eye: Secondary | ICD-10-CM | POA: Diagnosis not present

## 2018-11-19 DIAGNOSIS — Z87891 Personal history of nicotine dependence: Secondary | ICD-10-CM | POA: Diagnosis not present

## 2018-11-19 DIAGNOSIS — Z7902 Long term (current) use of antithrombotics/antiplatelets: Secondary | ICD-10-CM | POA: Diagnosis not present

## 2018-11-19 DIAGNOSIS — I951 Orthostatic hypotension: Secondary | ICD-10-CM | POA: Diagnosis not present

## 2018-11-20 DIAGNOSIS — K219 Gastro-esophageal reflux disease without esophagitis: Secondary | ICD-10-CM | POA: Diagnosis not present

## 2018-11-20 DIAGNOSIS — Z87891 Personal history of nicotine dependence: Secondary | ICD-10-CM | POA: Diagnosis not present

## 2018-11-20 DIAGNOSIS — R279 Unspecified lack of coordination: Secondary | ICD-10-CM | POA: Diagnosis not present

## 2018-11-20 DIAGNOSIS — Z9181 History of falling: Secondary | ICD-10-CM | POA: Diagnosis not present

## 2018-11-20 DIAGNOSIS — Z7902 Long term (current) use of antithrombotics/antiplatelets: Secondary | ICD-10-CM | POA: Diagnosis not present

## 2018-11-20 DIAGNOSIS — I951 Orthostatic hypotension: Secondary | ICD-10-CM | POA: Diagnosis not present

## 2018-11-20 DIAGNOSIS — M1990 Unspecified osteoarthritis, unspecified site: Secondary | ICD-10-CM | POA: Diagnosis not present

## 2018-11-20 DIAGNOSIS — H9190 Unspecified hearing loss, unspecified ear: Secondary | ICD-10-CM | POA: Diagnosis not present

## 2018-11-20 DIAGNOSIS — H53421 Scotoma of blind spot area, right eye: Secondary | ICD-10-CM | POA: Diagnosis not present

## 2018-11-20 DIAGNOSIS — I69398 Other sequelae of cerebral infarction: Secondary | ICD-10-CM | POA: Diagnosis not present

## 2018-11-20 DIAGNOSIS — E785 Hyperlipidemia, unspecified: Secondary | ICD-10-CM | POA: Diagnosis not present

## 2018-11-20 DIAGNOSIS — Z87442 Personal history of urinary calculi: Secondary | ICD-10-CM | POA: Diagnosis not present

## 2018-11-20 DIAGNOSIS — M6281 Muscle weakness (generalized): Secondary | ICD-10-CM | POA: Diagnosis not present

## 2018-11-20 DIAGNOSIS — Z7982 Long term (current) use of aspirin: Secondary | ICD-10-CM | POA: Diagnosis not present

## 2018-11-20 DIAGNOSIS — N189 Chronic kidney disease, unspecified: Secondary | ICD-10-CM | POA: Diagnosis not present

## 2018-11-20 DIAGNOSIS — G309 Alzheimer's disease, unspecified: Secondary | ICD-10-CM | POA: Diagnosis not present

## 2018-11-20 DIAGNOSIS — I69320 Aphasia following cerebral infarction: Secondary | ICD-10-CM | POA: Diagnosis not present

## 2018-11-20 DIAGNOSIS — I129 Hypertensive chronic kidney disease with stage 1 through stage 4 chronic kidney disease, or unspecified chronic kidney disease: Secondary | ICD-10-CM | POA: Diagnosis not present

## 2018-11-23 DIAGNOSIS — Z7982 Long term (current) use of aspirin: Secondary | ICD-10-CM | POA: Diagnosis not present

## 2018-11-23 DIAGNOSIS — I69320 Aphasia following cerebral infarction: Secondary | ICD-10-CM | POA: Diagnosis not present

## 2018-11-23 DIAGNOSIS — Z87442 Personal history of urinary calculi: Secondary | ICD-10-CM | POA: Diagnosis not present

## 2018-11-23 DIAGNOSIS — H9190 Unspecified hearing loss, unspecified ear: Secondary | ICD-10-CM | POA: Diagnosis not present

## 2018-11-23 DIAGNOSIS — M6281 Muscle weakness (generalized): Secondary | ICD-10-CM | POA: Diagnosis not present

## 2018-11-23 DIAGNOSIS — K219 Gastro-esophageal reflux disease without esophagitis: Secondary | ICD-10-CM | POA: Diagnosis not present

## 2018-11-23 DIAGNOSIS — I951 Orthostatic hypotension: Secondary | ICD-10-CM | POA: Diagnosis not present

## 2018-11-23 DIAGNOSIS — H53421 Scotoma of blind spot area, right eye: Secondary | ICD-10-CM | POA: Diagnosis not present

## 2018-11-23 DIAGNOSIS — Z87891 Personal history of nicotine dependence: Secondary | ICD-10-CM | POA: Diagnosis not present

## 2018-11-23 DIAGNOSIS — Z7902 Long term (current) use of antithrombotics/antiplatelets: Secondary | ICD-10-CM | POA: Diagnosis not present

## 2018-11-23 DIAGNOSIS — G309 Alzheimer's disease, unspecified: Secondary | ICD-10-CM | POA: Diagnosis not present

## 2018-11-23 DIAGNOSIS — I69398 Other sequelae of cerebral infarction: Secondary | ICD-10-CM | POA: Diagnosis not present

## 2018-11-23 DIAGNOSIS — E785 Hyperlipidemia, unspecified: Secondary | ICD-10-CM | POA: Diagnosis not present

## 2018-11-23 DIAGNOSIS — Z9181 History of falling: Secondary | ICD-10-CM | POA: Diagnosis not present

## 2018-11-23 DIAGNOSIS — R279 Unspecified lack of coordination: Secondary | ICD-10-CM | POA: Diagnosis not present

## 2018-11-23 DIAGNOSIS — I129 Hypertensive chronic kidney disease with stage 1 through stage 4 chronic kidney disease, or unspecified chronic kidney disease: Secondary | ICD-10-CM | POA: Diagnosis not present

## 2018-11-23 DIAGNOSIS — N189 Chronic kidney disease, unspecified: Secondary | ICD-10-CM | POA: Diagnosis not present

## 2018-11-23 DIAGNOSIS — M1990 Unspecified osteoarthritis, unspecified site: Secondary | ICD-10-CM | POA: Diagnosis not present

## 2018-11-24 DIAGNOSIS — K219 Gastro-esophageal reflux disease without esophagitis: Secondary | ICD-10-CM | POA: Diagnosis not present

## 2018-11-24 DIAGNOSIS — E785 Hyperlipidemia, unspecified: Secondary | ICD-10-CM | POA: Diagnosis not present

## 2018-11-24 DIAGNOSIS — Z7902 Long term (current) use of antithrombotics/antiplatelets: Secondary | ICD-10-CM | POA: Diagnosis not present

## 2018-11-24 DIAGNOSIS — I69320 Aphasia following cerebral infarction: Secondary | ICD-10-CM | POA: Diagnosis not present

## 2018-11-24 DIAGNOSIS — Z87891 Personal history of nicotine dependence: Secondary | ICD-10-CM | POA: Diagnosis not present

## 2018-11-24 DIAGNOSIS — H53421 Scotoma of blind spot area, right eye: Secondary | ICD-10-CM | POA: Diagnosis not present

## 2018-11-24 DIAGNOSIS — R279 Unspecified lack of coordination: Secondary | ICD-10-CM | POA: Diagnosis not present

## 2018-11-24 DIAGNOSIS — G309 Alzheimer's disease, unspecified: Secondary | ICD-10-CM | POA: Diagnosis not present

## 2018-11-24 DIAGNOSIS — H9190 Unspecified hearing loss, unspecified ear: Secondary | ICD-10-CM | POA: Diagnosis not present

## 2018-11-24 DIAGNOSIS — I951 Orthostatic hypotension: Secondary | ICD-10-CM | POA: Diagnosis not present

## 2018-11-24 DIAGNOSIS — Z87442 Personal history of urinary calculi: Secondary | ICD-10-CM | POA: Diagnosis not present

## 2018-11-24 DIAGNOSIS — Z7982 Long term (current) use of aspirin: Secondary | ICD-10-CM | POA: Diagnosis not present

## 2018-11-24 DIAGNOSIS — N189 Chronic kidney disease, unspecified: Secondary | ICD-10-CM | POA: Diagnosis not present

## 2018-11-24 DIAGNOSIS — M1990 Unspecified osteoarthritis, unspecified site: Secondary | ICD-10-CM | POA: Diagnosis not present

## 2018-11-24 DIAGNOSIS — Z9181 History of falling: Secondary | ICD-10-CM | POA: Diagnosis not present

## 2018-11-24 DIAGNOSIS — I129 Hypertensive chronic kidney disease with stage 1 through stage 4 chronic kidney disease, or unspecified chronic kidney disease: Secondary | ICD-10-CM | POA: Diagnosis not present

## 2018-11-24 DIAGNOSIS — I69398 Other sequelae of cerebral infarction: Secondary | ICD-10-CM | POA: Diagnosis not present

## 2018-11-24 DIAGNOSIS — M6281 Muscle weakness (generalized): Secondary | ICD-10-CM | POA: Diagnosis not present

## 2018-11-25 DIAGNOSIS — I69398 Other sequelae of cerebral infarction: Secondary | ICD-10-CM | POA: Diagnosis not present

## 2018-11-25 DIAGNOSIS — Z7982 Long term (current) use of aspirin: Secondary | ICD-10-CM | POA: Diagnosis not present

## 2018-11-25 DIAGNOSIS — M6281 Muscle weakness (generalized): Secondary | ICD-10-CM | POA: Diagnosis not present

## 2018-11-25 DIAGNOSIS — H53421 Scotoma of blind spot area, right eye: Secondary | ICD-10-CM | POA: Diagnosis not present

## 2018-11-25 DIAGNOSIS — Z7902 Long term (current) use of antithrombotics/antiplatelets: Secondary | ICD-10-CM | POA: Diagnosis not present

## 2018-11-25 DIAGNOSIS — R279 Unspecified lack of coordination: Secondary | ICD-10-CM | POA: Diagnosis not present

## 2018-11-25 DIAGNOSIS — K219 Gastro-esophageal reflux disease without esophagitis: Secondary | ICD-10-CM | POA: Diagnosis not present

## 2018-11-25 DIAGNOSIS — H9190 Unspecified hearing loss, unspecified ear: Secondary | ICD-10-CM | POA: Diagnosis not present

## 2018-11-25 DIAGNOSIS — M1990 Unspecified osteoarthritis, unspecified site: Secondary | ICD-10-CM | POA: Diagnosis not present

## 2018-11-25 DIAGNOSIS — Z87891 Personal history of nicotine dependence: Secondary | ICD-10-CM | POA: Diagnosis not present

## 2018-11-25 DIAGNOSIS — G309 Alzheimer's disease, unspecified: Secondary | ICD-10-CM | POA: Diagnosis not present

## 2018-11-25 DIAGNOSIS — E785 Hyperlipidemia, unspecified: Secondary | ICD-10-CM | POA: Diagnosis not present

## 2018-11-25 DIAGNOSIS — N189 Chronic kidney disease, unspecified: Secondary | ICD-10-CM | POA: Diagnosis not present

## 2018-11-25 DIAGNOSIS — Z9181 History of falling: Secondary | ICD-10-CM | POA: Diagnosis not present

## 2018-11-25 DIAGNOSIS — Z87442 Personal history of urinary calculi: Secondary | ICD-10-CM | POA: Diagnosis not present

## 2018-11-25 DIAGNOSIS — I129 Hypertensive chronic kidney disease with stage 1 through stage 4 chronic kidney disease, or unspecified chronic kidney disease: Secondary | ICD-10-CM | POA: Diagnosis not present

## 2018-11-25 DIAGNOSIS — I951 Orthostatic hypotension: Secondary | ICD-10-CM | POA: Diagnosis not present

## 2018-11-25 DIAGNOSIS — I69320 Aphasia following cerebral infarction: Secondary | ICD-10-CM | POA: Diagnosis not present

## 2018-11-27 DIAGNOSIS — M6281 Muscle weakness (generalized): Secondary | ICD-10-CM | POA: Diagnosis not present

## 2018-11-27 DIAGNOSIS — Z7982 Long term (current) use of aspirin: Secondary | ICD-10-CM | POA: Diagnosis not present

## 2018-11-27 DIAGNOSIS — Z87442 Personal history of urinary calculi: Secondary | ICD-10-CM | POA: Diagnosis not present

## 2018-11-27 DIAGNOSIS — I951 Orthostatic hypotension: Secondary | ICD-10-CM | POA: Diagnosis not present

## 2018-11-27 DIAGNOSIS — Z9181 History of falling: Secondary | ICD-10-CM | POA: Diagnosis not present

## 2018-11-27 DIAGNOSIS — Z87891 Personal history of nicotine dependence: Secondary | ICD-10-CM | POA: Diagnosis not present

## 2018-11-27 DIAGNOSIS — H53421 Scotoma of blind spot area, right eye: Secondary | ICD-10-CM | POA: Diagnosis not present

## 2018-11-27 DIAGNOSIS — R279 Unspecified lack of coordination: Secondary | ICD-10-CM | POA: Diagnosis not present

## 2018-11-27 DIAGNOSIS — K219 Gastro-esophageal reflux disease without esophagitis: Secondary | ICD-10-CM | POA: Diagnosis not present

## 2018-11-27 DIAGNOSIS — I129 Hypertensive chronic kidney disease with stage 1 through stage 4 chronic kidney disease, or unspecified chronic kidney disease: Secondary | ICD-10-CM | POA: Diagnosis not present

## 2018-11-27 DIAGNOSIS — Z7902 Long term (current) use of antithrombotics/antiplatelets: Secondary | ICD-10-CM | POA: Diagnosis not present

## 2018-11-27 DIAGNOSIS — N189 Chronic kidney disease, unspecified: Secondary | ICD-10-CM | POA: Diagnosis not present

## 2018-11-27 DIAGNOSIS — G309 Alzheimer's disease, unspecified: Secondary | ICD-10-CM | POA: Diagnosis not present

## 2018-11-27 DIAGNOSIS — I69320 Aphasia following cerebral infarction: Secondary | ICD-10-CM | POA: Diagnosis not present

## 2018-11-27 DIAGNOSIS — I69398 Other sequelae of cerebral infarction: Secondary | ICD-10-CM | POA: Diagnosis not present

## 2018-11-27 DIAGNOSIS — H9190 Unspecified hearing loss, unspecified ear: Secondary | ICD-10-CM | POA: Diagnosis not present

## 2018-11-27 DIAGNOSIS — M1990 Unspecified osteoarthritis, unspecified site: Secondary | ICD-10-CM | POA: Diagnosis not present

## 2018-11-27 DIAGNOSIS — E785 Hyperlipidemia, unspecified: Secondary | ICD-10-CM | POA: Diagnosis not present

## 2018-11-30 ENCOUNTER — Ambulatory Visit: Payer: Medicare Other | Admitting: Family Medicine

## 2018-11-30 DIAGNOSIS — G309 Alzheimer's disease, unspecified: Secondary | ICD-10-CM | POA: Diagnosis not present

## 2018-11-30 DIAGNOSIS — R279 Unspecified lack of coordination: Secondary | ICD-10-CM | POA: Diagnosis not present

## 2018-11-30 DIAGNOSIS — H53421 Scotoma of blind spot area, right eye: Secondary | ICD-10-CM | POA: Diagnosis not present

## 2018-11-30 DIAGNOSIS — E785 Hyperlipidemia, unspecified: Secondary | ICD-10-CM | POA: Diagnosis not present

## 2018-11-30 DIAGNOSIS — I69320 Aphasia following cerebral infarction: Secondary | ICD-10-CM | POA: Diagnosis not present

## 2018-11-30 DIAGNOSIS — N189 Chronic kidney disease, unspecified: Secondary | ICD-10-CM | POA: Diagnosis not present

## 2018-11-30 DIAGNOSIS — H9190 Unspecified hearing loss, unspecified ear: Secondary | ICD-10-CM | POA: Diagnosis not present

## 2018-11-30 DIAGNOSIS — Z87442 Personal history of urinary calculi: Secondary | ICD-10-CM | POA: Diagnosis not present

## 2018-11-30 DIAGNOSIS — K219 Gastro-esophageal reflux disease without esophagitis: Secondary | ICD-10-CM | POA: Diagnosis not present

## 2018-11-30 DIAGNOSIS — Z7902 Long term (current) use of antithrombotics/antiplatelets: Secondary | ICD-10-CM | POA: Diagnosis not present

## 2018-11-30 DIAGNOSIS — I951 Orthostatic hypotension: Secondary | ICD-10-CM | POA: Diagnosis not present

## 2018-11-30 DIAGNOSIS — I129 Hypertensive chronic kidney disease with stage 1 through stage 4 chronic kidney disease, or unspecified chronic kidney disease: Secondary | ICD-10-CM | POA: Diagnosis not present

## 2018-11-30 DIAGNOSIS — M1990 Unspecified osteoarthritis, unspecified site: Secondary | ICD-10-CM | POA: Diagnosis not present

## 2018-11-30 DIAGNOSIS — I69398 Other sequelae of cerebral infarction: Secondary | ICD-10-CM | POA: Diagnosis not present

## 2018-11-30 DIAGNOSIS — Z9181 History of falling: Secondary | ICD-10-CM | POA: Diagnosis not present

## 2018-11-30 DIAGNOSIS — Z87891 Personal history of nicotine dependence: Secondary | ICD-10-CM | POA: Diagnosis not present

## 2018-11-30 DIAGNOSIS — Z7982 Long term (current) use of aspirin: Secondary | ICD-10-CM | POA: Diagnosis not present

## 2018-11-30 DIAGNOSIS — M6281 Muscle weakness (generalized): Secondary | ICD-10-CM | POA: Diagnosis not present

## 2018-12-01 DIAGNOSIS — Z7902 Long term (current) use of antithrombotics/antiplatelets: Secondary | ICD-10-CM | POA: Diagnosis not present

## 2018-12-01 DIAGNOSIS — H53421 Scotoma of blind spot area, right eye: Secondary | ICD-10-CM | POA: Diagnosis not present

## 2018-12-01 DIAGNOSIS — M1990 Unspecified osteoarthritis, unspecified site: Secondary | ICD-10-CM | POA: Diagnosis not present

## 2018-12-01 DIAGNOSIS — M6281 Muscle weakness (generalized): Secondary | ICD-10-CM | POA: Diagnosis not present

## 2018-12-01 DIAGNOSIS — H9190 Unspecified hearing loss, unspecified ear: Secondary | ICD-10-CM | POA: Diagnosis not present

## 2018-12-01 DIAGNOSIS — K219 Gastro-esophageal reflux disease without esophagitis: Secondary | ICD-10-CM | POA: Diagnosis not present

## 2018-12-01 DIAGNOSIS — R279 Unspecified lack of coordination: Secondary | ICD-10-CM | POA: Diagnosis not present

## 2018-12-01 DIAGNOSIS — E785 Hyperlipidemia, unspecified: Secondary | ICD-10-CM | POA: Diagnosis not present

## 2018-12-01 DIAGNOSIS — I129 Hypertensive chronic kidney disease with stage 1 through stage 4 chronic kidney disease, or unspecified chronic kidney disease: Secondary | ICD-10-CM | POA: Diagnosis not present

## 2018-12-01 DIAGNOSIS — I69398 Other sequelae of cerebral infarction: Secondary | ICD-10-CM | POA: Diagnosis not present

## 2018-12-01 DIAGNOSIS — I951 Orthostatic hypotension: Secondary | ICD-10-CM | POA: Diagnosis not present

## 2018-12-01 DIAGNOSIS — Z87891 Personal history of nicotine dependence: Secondary | ICD-10-CM | POA: Diagnosis not present

## 2018-12-01 DIAGNOSIS — Z7982 Long term (current) use of aspirin: Secondary | ICD-10-CM | POA: Diagnosis not present

## 2018-12-01 DIAGNOSIS — G309 Alzheimer's disease, unspecified: Secondary | ICD-10-CM | POA: Diagnosis not present

## 2018-12-01 DIAGNOSIS — Z9181 History of falling: Secondary | ICD-10-CM | POA: Diagnosis not present

## 2018-12-01 DIAGNOSIS — N189 Chronic kidney disease, unspecified: Secondary | ICD-10-CM | POA: Diagnosis not present

## 2018-12-01 DIAGNOSIS — Z87442 Personal history of urinary calculi: Secondary | ICD-10-CM | POA: Diagnosis not present

## 2018-12-01 DIAGNOSIS — I69320 Aphasia following cerebral infarction: Secondary | ICD-10-CM | POA: Diagnosis not present

## 2018-12-02 DIAGNOSIS — I129 Hypertensive chronic kidney disease with stage 1 through stage 4 chronic kidney disease, or unspecified chronic kidney disease: Secondary | ICD-10-CM | POA: Diagnosis not present

## 2018-12-02 DIAGNOSIS — Z7982 Long term (current) use of aspirin: Secondary | ICD-10-CM | POA: Diagnosis not present

## 2018-12-02 DIAGNOSIS — G309 Alzheimer's disease, unspecified: Secondary | ICD-10-CM | POA: Diagnosis not present

## 2018-12-02 DIAGNOSIS — K219 Gastro-esophageal reflux disease without esophagitis: Secondary | ICD-10-CM | POA: Diagnosis not present

## 2018-12-02 DIAGNOSIS — H53421 Scotoma of blind spot area, right eye: Secondary | ICD-10-CM | POA: Diagnosis not present

## 2018-12-02 DIAGNOSIS — I69320 Aphasia following cerebral infarction: Secondary | ICD-10-CM | POA: Diagnosis not present

## 2018-12-02 DIAGNOSIS — Z9181 History of falling: Secondary | ICD-10-CM | POA: Diagnosis not present

## 2018-12-02 DIAGNOSIS — Z87442 Personal history of urinary calculi: Secondary | ICD-10-CM | POA: Diagnosis not present

## 2018-12-02 DIAGNOSIS — I69398 Other sequelae of cerebral infarction: Secondary | ICD-10-CM | POA: Diagnosis not present

## 2018-12-02 DIAGNOSIS — M1990 Unspecified osteoarthritis, unspecified site: Secondary | ICD-10-CM | POA: Diagnosis not present

## 2018-12-02 DIAGNOSIS — R279 Unspecified lack of coordination: Secondary | ICD-10-CM | POA: Diagnosis not present

## 2018-12-02 DIAGNOSIS — I10 Essential (primary) hypertension: Secondary | ICD-10-CM | POA: Diagnosis not present

## 2018-12-02 DIAGNOSIS — Z8673 Personal history of transient ischemic attack (TIA), and cerebral infarction without residual deficits: Secondary | ICD-10-CM | POA: Diagnosis not present

## 2018-12-02 DIAGNOSIS — Z7902 Long term (current) use of antithrombotics/antiplatelets: Secondary | ICD-10-CM | POA: Diagnosis not present

## 2018-12-02 DIAGNOSIS — N189 Chronic kidney disease, unspecified: Secondary | ICD-10-CM | POA: Diagnosis not present

## 2018-12-02 DIAGNOSIS — E785 Hyperlipidemia, unspecified: Secondary | ICD-10-CM | POA: Diagnosis not present

## 2018-12-02 DIAGNOSIS — E7849 Other hyperlipidemia: Secondary | ICD-10-CM | POA: Diagnosis not present

## 2018-12-02 DIAGNOSIS — Z87891 Personal history of nicotine dependence: Secondary | ICD-10-CM | POA: Diagnosis not present

## 2018-12-02 DIAGNOSIS — I951 Orthostatic hypotension: Secondary | ICD-10-CM | POA: Diagnosis not present

## 2018-12-02 DIAGNOSIS — I679 Cerebrovascular disease, unspecified: Secondary | ICD-10-CM | POA: Diagnosis not present

## 2018-12-02 DIAGNOSIS — M6281 Muscle weakness (generalized): Secondary | ICD-10-CM | POA: Diagnosis not present

## 2018-12-02 DIAGNOSIS — H9190 Unspecified hearing loss, unspecified ear: Secondary | ICD-10-CM | POA: Diagnosis not present

## 2018-12-03 DIAGNOSIS — I129 Hypertensive chronic kidney disease with stage 1 through stage 4 chronic kidney disease, or unspecified chronic kidney disease: Secondary | ICD-10-CM | POA: Diagnosis not present

## 2018-12-03 DIAGNOSIS — Z9181 History of falling: Secondary | ICD-10-CM | POA: Diagnosis not present

## 2018-12-03 DIAGNOSIS — I69398 Other sequelae of cerebral infarction: Secondary | ICD-10-CM | POA: Diagnosis not present

## 2018-12-03 DIAGNOSIS — I69320 Aphasia following cerebral infarction: Secondary | ICD-10-CM | POA: Diagnosis not present

## 2018-12-03 DIAGNOSIS — R279 Unspecified lack of coordination: Secondary | ICD-10-CM | POA: Diagnosis not present

## 2018-12-03 DIAGNOSIS — Z7982 Long term (current) use of aspirin: Secondary | ICD-10-CM | POA: Diagnosis not present

## 2018-12-03 DIAGNOSIS — Z87891 Personal history of nicotine dependence: Secondary | ICD-10-CM | POA: Diagnosis not present

## 2018-12-03 DIAGNOSIS — Z7902 Long term (current) use of antithrombotics/antiplatelets: Secondary | ICD-10-CM | POA: Diagnosis not present

## 2018-12-03 DIAGNOSIS — H9190 Unspecified hearing loss, unspecified ear: Secondary | ICD-10-CM | POA: Diagnosis not present

## 2018-12-03 DIAGNOSIS — H53421 Scotoma of blind spot area, right eye: Secondary | ICD-10-CM | POA: Diagnosis not present

## 2018-12-03 DIAGNOSIS — I951 Orthostatic hypotension: Secondary | ICD-10-CM | POA: Diagnosis not present

## 2018-12-03 DIAGNOSIS — M6281 Muscle weakness (generalized): Secondary | ICD-10-CM | POA: Diagnosis not present

## 2018-12-03 DIAGNOSIS — M1990 Unspecified osteoarthritis, unspecified site: Secondary | ICD-10-CM | POA: Diagnosis not present

## 2018-12-03 DIAGNOSIS — K219 Gastro-esophageal reflux disease without esophagitis: Secondary | ICD-10-CM | POA: Diagnosis not present

## 2018-12-03 DIAGNOSIS — Z87442 Personal history of urinary calculi: Secondary | ICD-10-CM | POA: Diagnosis not present

## 2018-12-03 DIAGNOSIS — E785 Hyperlipidemia, unspecified: Secondary | ICD-10-CM | POA: Diagnosis not present

## 2018-12-03 DIAGNOSIS — N189 Chronic kidney disease, unspecified: Secondary | ICD-10-CM | POA: Diagnosis not present

## 2018-12-03 DIAGNOSIS — G309 Alzheimer's disease, unspecified: Secondary | ICD-10-CM | POA: Diagnosis not present

## 2018-12-07 DIAGNOSIS — Z9181 History of falling: Secondary | ICD-10-CM | POA: Diagnosis not present

## 2018-12-07 DIAGNOSIS — I69320 Aphasia following cerebral infarction: Secondary | ICD-10-CM | POA: Diagnosis not present

## 2018-12-07 DIAGNOSIS — I951 Orthostatic hypotension: Secondary | ICD-10-CM | POA: Diagnosis not present

## 2018-12-07 DIAGNOSIS — M6281 Muscle weakness (generalized): Secondary | ICD-10-CM | POA: Diagnosis not present

## 2018-12-07 DIAGNOSIS — I69398 Other sequelae of cerebral infarction: Secondary | ICD-10-CM | POA: Diagnosis not present

## 2018-12-07 DIAGNOSIS — H53421 Scotoma of blind spot area, right eye: Secondary | ICD-10-CM | POA: Diagnosis not present

## 2018-12-07 DIAGNOSIS — N189 Chronic kidney disease, unspecified: Secondary | ICD-10-CM | POA: Diagnosis not present

## 2018-12-07 DIAGNOSIS — K219 Gastro-esophageal reflux disease without esophagitis: Secondary | ICD-10-CM | POA: Diagnosis not present

## 2018-12-07 DIAGNOSIS — I639 Cerebral infarction, unspecified: Secondary | ICD-10-CM | POA: Diagnosis not present

## 2018-12-07 DIAGNOSIS — H9190 Unspecified hearing loss, unspecified ear: Secondary | ICD-10-CM | POA: Diagnosis not present

## 2018-12-07 DIAGNOSIS — Z87891 Personal history of nicotine dependence: Secondary | ICD-10-CM | POA: Diagnosis not present

## 2018-12-07 DIAGNOSIS — Z7982 Long term (current) use of aspirin: Secondary | ICD-10-CM | POA: Diagnosis not present

## 2018-12-07 DIAGNOSIS — M1990 Unspecified osteoarthritis, unspecified site: Secondary | ICD-10-CM | POA: Diagnosis not present

## 2018-12-07 DIAGNOSIS — E785 Hyperlipidemia, unspecified: Secondary | ICD-10-CM | POA: Diagnosis not present

## 2018-12-07 DIAGNOSIS — R279 Unspecified lack of coordination: Secondary | ICD-10-CM | POA: Diagnosis not present

## 2018-12-07 DIAGNOSIS — Z87442 Personal history of urinary calculi: Secondary | ICD-10-CM | POA: Diagnosis not present

## 2018-12-07 DIAGNOSIS — Z7902 Long term (current) use of antithrombotics/antiplatelets: Secondary | ICD-10-CM | POA: Diagnosis not present

## 2018-12-07 DIAGNOSIS — I129 Hypertensive chronic kidney disease with stage 1 through stage 4 chronic kidney disease, or unspecified chronic kidney disease: Secondary | ICD-10-CM | POA: Diagnosis not present

## 2018-12-07 DIAGNOSIS — G309 Alzheimer's disease, unspecified: Secondary | ICD-10-CM | POA: Diagnosis not present

## 2018-12-08 DIAGNOSIS — K219 Gastro-esophageal reflux disease without esophagitis: Secondary | ICD-10-CM | POA: Diagnosis not present

## 2018-12-08 DIAGNOSIS — I951 Orthostatic hypotension: Secondary | ICD-10-CM | POA: Diagnosis not present

## 2018-12-08 DIAGNOSIS — Z7902 Long term (current) use of antithrombotics/antiplatelets: Secondary | ICD-10-CM | POA: Diagnosis not present

## 2018-12-08 DIAGNOSIS — Z87891 Personal history of nicotine dependence: Secondary | ICD-10-CM | POA: Diagnosis not present

## 2018-12-08 DIAGNOSIS — I69320 Aphasia following cerebral infarction: Secondary | ICD-10-CM | POA: Diagnosis not present

## 2018-12-08 DIAGNOSIS — I129 Hypertensive chronic kidney disease with stage 1 through stage 4 chronic kidney disease, or unspecified chronic kidney disease: Secondary | ICD-10-CM | POA: Diagnosis not present

## 2018-12-08 DIAGNOSIS — H9190 Unspecified hearing loss, unspecified ear: Secondary | ICD-10-CM | POA: Diagnosis not present

## 2018-12-08 DIAGNOSIS — M6281 Muscle weakness (generalized): Secondary | ICD-10-CM | POA: Diagnosis not present

## 2018-12-08 DIAGNOSIS — Z7982 Long term (current) use of aspirin: Secondary | ICD-10-CM | POA: Diagnosis not present

## 2018-12-08 DIAGNOSIS — Z87442 Personal history of urinary calculi: Secondary | ICD-10-CM | POA: Diagnosis not present

## 2018-12-08 DIAGNOSIS — R279 Unspecified lack of coordination: Secondary | ICD-10-CM | POA: Diagnosis not present

## 2018-12-08 DIAGNOSIS — I69398 Other sequelae of cerebral infarction: Secondary | ICD-10-CM | POA: Diagnosis not present

## 2018-12-08 DIAGNOSIS — Z9181 History of falling: Secondary | ICD-10-CM | POA: Diagnosis not present

## 2018-12-08 DIAGNOSIS — G309 Alzheimer's disease, unspecified: Secondary | ICD-10-CM | POA: Diagnosis not present

## 2018-12-08 DIAGNOSIS — E785 Hyperlipidemia, unspecified: Secondary | ICD-10-CM | POA: Diagnosis not present

## 2018-12-08 DIAGNOSIS — M1990 Unspecified osteoarthritis, unspecified site: Secondary | ICD-10-CM | POA: Diagnosis not present

## 2018-12-08 DIAGNOSIS — N189 Chronic kidney disease, unspecified: Secondary | ICD-10-CM | POA: Diagnosis not present

## 2018-12-08 DIAGNOSIS — H53421 Scotoma of blind spot area, right eye: Secondary | ICD-10-CM | POA: Diagnosis not present

## 2018-12-09 DIAGNOSIS — G309 Alzheimer's disease, unspecified: Secondary | ICD-10-CM | POA: Diagnosis not present

## 2018-12-09 DIAGNOSIS — Z7982 Long term (current) use of aspirin: Secondary | ICD-10-CM | POA: Diagnosis not present

## 2018-12-09 DIAGNOSIS — H9190 Unspecified hearing loss, unspecified ear: Secondary | ICD-10-CM | POA: Diagnosis not present

## 2018-12-09 DIAGNOSIS — Z87442 Personal history of urinary calculi: Secondary | ICD-10-CM | POA: Diagnosis not present

## 2018-12-09 DIAGNOSIS — Z87891 Personal history of nicotine dependence: Secondary | ICD-10-CM | POA: Diagnosis not present

## 2018-12-09 DIAGNOSIS — Z9181 History of falling: Secondary | ICD-10-CM | POA: Diagnosis not present

## 2018-12-09 DIAGNOSIS — I129 Hypertensive chronic kidney disease with stage 1 through stage 4 chronic kidney disease, or unspecified chronic kidney disease: Secondary | ICD-10-CM | POA: Diagnosis not present

## 2018-12-09 DIAGNOSIS — E785 Hyperlipidemia, unspecified: Secondary | ICD-10-CM | POA: Diagnosis not present

## 2018-12-09 DIAGNOSIS — M6281 Muscle weakness (generalized): Secondary | ICD-10-CM | POA: Diagnosis not present

## 2018-12-09 DIAGNOSIS — H53421 Scotoma of blind spot area, right eye: Secondary | ICD-10-CM | POA: Diagnosis not present

## 2018-12-09 DIAGNOSIS — I69320 Aphasia following cerebral infarction: Secondary | ICD-10-CM | POA: Diagnosis not present

## 2018-12-09 DIAGNOSIS — N189 Chronic kidney disease, unspecified: Secondary | ICD-10-CM | POA: Diagnosis not present

## 2018-12-09 DIAGNOSIS — I69398 Other sequelae of cerebral infarction: Secondary | ICD-10-CM | POA: Diagnosis not present

## 2018-12-09 DIAGNOSIS — M1990 Unspecified osteoarthritis, unspecified site: Secondary | ICD-10-CM | POA: Diagnosis not present

## 2018-12-09 DIAGNOSIS — R279 Unspecified lack of coordination: Secondary | ICD-10-CM | POA: Diagnosis not present

## 2018-12-09 DIAGNOSIS — Z7902 Long term (current) use of antithrombotics/antiplatelets: Secondary | ICD-10-CM | POA: Diagnosis not present

## 2018-12-09 DIAGNOSIS — I951 Orthostatic hypotension: Secondary | ICD-10-CM | POA: Diagnosis not present

## 2018-12-09 DIAGNOSIS — K219 Gastro-esophageal reflux disease without esophagitis: Secondary | ICD-10-CM | POA: Diagnosis not present

## 2018-12-10 DIAGNOSIS — I129 Hypertensive chronic kidney disease with stage 1 through stage 4 chronic kidney disease, or unspecified chronic kidney disease: Secondary | ICD-10-CM | POA: Diagnosis not present

## 2018-12-10 DIAGNOSIS — N189 Chronic kidney disease, unspecified: Secondary | ICD-10-CM | POA: Diagnosis not present

## 2018-12-10 DIAGNOSIS — E785 Hyperlipidemia, unspecified: Secondary | ICD-10-CM | POA: Diagnosis not present

## 2018-12-10 DIAGNOSIS — Z87891 Personal history of nicotine dependence: Secondary | ICD-10-CM | POA: Diagnosis not present

## 2018-12-10 DIAGNOSIS — I69320 Aphasia following cerebral infarction: Secondary | ICD-10-CM | POA: Diagnosis not present

## 2018-12-10 DIAGNOSIS — K219 Gastro-esophageal reflux disease without esophagitis: Secondary | ICD-10-CM | POA: Diagnosis not present

## 2018-12-10 DIAGNOSIS — Z87442 Personal history of urinary calculi: Secondary | ICD-10-CM | POA: Diagnosis not present

## 2018-12-10 DIAGNOSIS — H53421 Scotoma of blind spot area, right eye: Secondary | ICD-10-CM | POA: Diagnosis not present

## 2018-12-10 DIAGNOSIS — Z7902 Long term (current) use of antithrombotics/antiplatelets: Secondary | ICD-10-CM | POA: Diagnosis not present

## 2018-12-10 DIAGNOSIS — G309 Alzheimer's disease, unspecified: Secondary | ICD-10-CM | POA: Diagnosis not present

## 2018-12-10 DIAGNOSIS — M6281 Muscle weakness (generalized): Secondary | ICD-10-CM | POA: Diagnosis not present

## 2018-12-10 DIAGNOSIS — I951 Orthostatic hypotension: Secondary | ICD-10-CM | POA: Diagnosis not present

## 2018-12-10 DIAGNOSIS — Z7982 Long term (current) use of aspirin: Secondary | ICD-10-CM | POA: Diagnosis not present

## 2018-12-10 DIAGNOSIS — Z9181 History of falling: Secondary | ICD-10-CM | POA: Diagnosis not present

## 2018-12-10 DIAGNOSIS — R279 Unspecified lack of coordination: Secondary | ICD-10-CM | POA: Diagnosis not present

## 2018-12-10 DIAGNOSIS — M1990 Unspecified osteoarthritis, unspecified site: Secondary | ICD-10-CM | POA: Diagnosis not present

## 2018-12-10 DIAGNOSIS — H9190 Unspecified hearing loss, unspecified ear: Secondary | ICD-10-CM | POA: Diagnosis not present

## 2018-12-10 DIAGNOSIS — I69398 Other sequelae of cerebral infarction: Secondary | ICD-10-CM | POA: Diagnosis not present

## 2018-12-14 DIAGNOSIS — Z87891 Personal history of nicotine dependence: Secondary | ICD-10-CM | POA: Diagnosis not present

## 2018-12-14 DIAGNOSIS — K219 Gastro-esophageal reflux disease without esophagitis: Secondary | ICD-10-CM | POA: Diagnosis not present

## 2018-12-14 DIAGNOSIS — H53421 Scotoma of blind spot area, right eye: Secondary | ICD-10-CM | POA: Diagnosis not present

## 2018-12-14 DIAGNOSIS — Z7982 Long term (current) use of aspirin: Secondary | ICD-10-CM | POA: Diagnosis not present

## 2018-12-14 DIAGNOSIS — R279 Unspecified lack of coordination: Secondary | ICD-10-CM | POA: Diagnosis not present

## 2018-12-14 DIAGNOSIS — M6281 Muscle weakness (generalized): Secondary | ICD-10-CM | POA: Diagnosis not present

## 2018-12-14 DIAGNOSIS — M1990 Unspecified osteoarthritis, unspecified site: Secondary | ICD-10-CM | POA: Diagnosis not present

## 2018-12-14 DIAGNOSIS — H9190 Unspecified hearing loss, unspecified ear: Secondary | ICD-10-CM | POA: Diagnosis not present

## 2018-12-14 DIAGNOSIS — I129 Hypertensive chronic kidney disease with stage 1 through stage 4 chronic kidney disease, or unspecified chronic kidney disease: Secondary | ICD-10-CM | POA: Diagnosis not present

## 2018-12-14 DIAGNOSIS — E785 Hyperlipidemia, unspecified: Secondary | ICD-10-CM | POA: Diagnosis not present

## 2018-12-14 DIAGNOSIS — Z7902 Long term (current) use of antithrombotics/antiplatelets: Secondary | ICD-10-CM | POA: Diagnosis not present

## 2018-12-14 DIAGNOSIS — Z9181 History of falling: Secondary | ICD-10-CM | POA: Diagnosis not present

## 2018-12-14 DIAGNOSIS — I69320 Aphasia following cerebral infarction: Secondary | ICD-10-CM | POA: Diagnosis not present

## 2018-12-14 DIAGNOSIS — I951 Orthostatic hypotension: Secondary | ICD-10-CM | POA: Diagnosis not present

## 2018-12-14 DIAGNOSIS — N189 Chronic kidney disease, unspecified: Secondary | ICD-10-CM | POA: Diagnosis not present

## 2018-12-14 DIAGNOSIS — G309 Alzheimer's disease, unspecified: Secondary | ICD-10-CM | POA: Diagnosis not present

## 2018-12-14 DIAGNOSIS — Z87442 Personal history of urinary calculi: Secondary | ICD-10-CM | POA: Diagnosis not present

## 2018-12-14 DIAGNOSIS — I69398 Other sequelae of cerebral infarction: Secondary | ICD-10-CM | POA: Diagnosis not present

## 2018-12-15 DIAGNOSIS — Z87891 Personal history of nicotine dependence: Secondary | ICD-10-CM | POA: Diagnosis not present

## 2018-12-15 DIAGNOSIS — K219 Gastro-esophageal reflux disease without esophagitis: Secondary | ICD-10-CM | POA: Diagnosis not present

## 2018-12-15 DIAGNOSIS — M1990 Unspecified osteoarthritis, unspecified site: Secondary | ICD-10-CM | POA: Diagnosis not present

## 2018-12-15 DIAGNOSIS — M6281 Muscle weakness (generalized): Secondary | ICD-10-CM | POA: Diagnosis not present

## 2018-12-15 DIAGNOSIS — N189 Chronic kidney disease, unspecified: Secondary | ICD-10-CM | POA: Diagnosis not present

## 2018-12-15 DIAGNOSIS — R279 Unspecified lack of coordination: Secondary | ICD-10-CM | POA: Diagnosis not present

## 2018-12-15 DIAGNOSIS — G309 Alzheimer's disease, unspecified: Secondary | ICD-10-CM | POA: Diagnosis not present

## 2018-12-15 DIAGNOSIS — H9190 Unspecified hearing loss, unspecified ear: Secondary | ICD-10-CM | POA: Diagnosis not present

## 2018-12-15 DIAGNOSIS — I129 Hypertensive chronic kidney disease with stage 1 through stage 4 chronic kidney disease, or unspecified chronic kidney disease: Secondary | ICD-10-CM | POA: Diagnosis not present

## 2018-12-15 DIAGNOSIS — H53421 Scotoma of blind spot area, right eye: Secondary | ICD-10-CM | POA: Diagnosis not present

## 2018-12-15 DIAGNOSIS — Z9181 History of falling: Secondary | ICD-10-CM | POA: Diagnosis not present

## 2018-12-15 DIAGNOSIS — I69320 Aphasia following cerebral infarction: Secondary | ICD-10-CM | POA: Diagnosis not present

## 2018-12-15 DIAGNOSIS — E785 Hyperlipidemia, unspecified: Secondary | ICD-10-CM | POA: Diagnosis not present

## 2018-12-15 DIAGNOSIS — I951 Orthostatic hypotension: Secondary | ICD-10-CM | POA: Diagnosis not present

## 2018-12-15 DIAGNOSIS — Z7902 Long term (current) use of antithrombotics/antiplatelets: Secondary | ICD-10-CM | POA: Diagnosis not present

## 2018-12-15 DIAGNOSIS — I69398 Other sequelae of cerebral infarction: Secondary | ICD-10-CM | POA: Diagnosis not present

## 2018-12-15 DIAGNOSIS — I10 Essential (primary) hypertension: Secondary | ICD-10-CM | POA: Diagnosis not present

## 2018-12-15 DIAGNOSIS — I69351 Hemiplegia and hemiparesis following cerebral infarction affecting right dominant side: Secondary | ICD-10-CM | POA: Diagnosis not present

## 2018-12-15 DIAGNOSIS — Z87442 Personal history of urinary calculi: Secondary | ICD-10-CM | POA: Diagnosis not present

## 2018-12-15 DIAGNOSIS — Z7982 Long term (current) use of aspirin: Secondary | ICD-10-CM | POA: Diagnosis not present

## 2018-12-16 DIAGNOSIS — M6281 Muscle weakness (generalized): Secondary | ICD-10-CM | POA: Diagnosis not present

## 2018-12-16 DIAGNOSIS — H53421 Scotoma of blind spot area, right eye: Secondary | ICD-10-CM | POA: Diagnosis not present

## 2018-12-16 DIAGNOSIS — G309 Alzheimer's disease, unspecified: Secondary | ICD-10-CM | POA: Diagnosis not present

## 2018-12-16 DIAGNOSIS — I69320 Aphasia following cerebral infarction: Secondary | ICD-10-CM | POA: Diagnosis not present

## 2018-12-16 DIAGNOSIS — Z87442 Personal history of urinary calculi: Secondary | ICD-10-CM | POA: Diagnosis not present

## 2018-12-16 DIAGNOSIS — K219 Gastro-esophageal reflux disease without esophagitis: Secondary | ICD-10-CM | POA: Diagnosis not present

## 2018-12-16 DIAGNOSIS — E785 Hyperlipidemia, unspecified: Secondary | ICD-10-CM | POA: Diagnosis not present

## 2018-12-16 DIAGNOSIS — M1990 Unspecified osteoarthritis, unspecified site: Secondary | ICD-10-CM | POA: Diagnosis not present

## 2018-12-16 DIAGNOSIS — I951 Orthostatic hypotension: Secondary | ICD-10-CM | POA: Diagnosis not present

## 2018-12-16 DIAGNOSIS — N189 Chronic kidney disease, unspecified: Secondary | ICD-10-CM | POA: Diagnosis not present

## 2018-12-16 DIAGNOSIS — I129 Hypertensive chronic kidney disease with stage 1 through stage 4 chronic kidney disease, or unspecified chronic kidney disease: Secondary | ICD-10-CM | POA: Diagnosis not present

## 2018-12-16 DIAGNOSIS — I69398 Other sequelae of cerebral infarction: Secondary | ICD-10-CM | POA: Diagnosis not present

## 2018-12-16 DIAGNOSIS — H9190 Unspecified hearing loss, unspecified ear: Secondary | ICD-10-CM | POA: Diagnosis not present

## 2018-12-16 DIAGNOSIS — Z87891 Personal history of nicotine dependence: Secondary | ICD-10-CM | POA: Diagnosis not present

## 2018-12-16 DIAGNOSIS — R279 Unspecified lack of coordination: Secondary | ICD-10-CM | POA: Diagnosis not present

## 2018-12-16 DIAGNOSIS — Z7902 Long term (current) use of antithrombotics/antiplatelets: Secondary | ICD-10-CM | POA: Diagnosis not present

## 2018-12-16 DIAGNOSIS — Z9181 History of falling: Secondary | ICD-10-CM | POA: Diagnosis not present

## 2018-12-16 DIAGNOSIS — Z7982 Long term (current) use of aspirin: Secondary | ICD-10-CM | POA: Diagnosis not present

## 2018-12-17 DIAGNOSIS — I69320 Aphasia following cerebral infarction: Secondary | ICD-10-CM | POA: Diagnosis not present

## 2018-12-17 DIAGNOSIS — Z7982 Long term (current) use of aspirin: Secondary | ICD-10-CM | POA: Diagnosis not present

## 2018-12-17 DIAGNOSIS — H53421 Scotoma of blind spot area, right eye: Secondary | ICD-10-CM | POA: Diagnosis not present

## 2018-12-17 DIAGNOSIS — I69398 Other sequelae of cerebral infarction: Secondary | ICD-10-CM | POA: Diagnosis not present

## 2018-12-17 DIAGNOSIS — N189 Chronic kidney disease, unspecified: Secondary | ICD-10-CM | POA: Diagnosis not present

## 2018-12-17 DIAGNOSIS — M6281 Muscle weakness (generalized): Secondary | ICD-10-CM | POA: Diagnosis not present

## 2018-12-17 DIAGNOSIS — H9190 Unspecified hearing loss, unspecified ear: Secondary | ICD-10-CM | POA: Diagnosis not present

## 2018-12-17 DIAGNOSIS — I129 Hypertensive chronic kidney disease with stage 1 through stage 4 chronic kidney disease, or unspecified chronic kidney disease: Secondary | ICD-10-CM | POA: Diagnosis not present

## 2018-12-17 DIAGNOSIS — Z9181 History of falling: Secondary | ICD-10-CM | POA: Diagnosis not present

## 2018-12-17 DIAGNOSIS — I951 Orthostatic hypotension: Secondary | ICD-10-CM | POA: Diagnosis not present

## 2018-12-17 DIAGNOSIS — K219 Gastro-esophageal reflux disease without esophagitis: Secondary | ICD-10-CM | POA: Diagnosis not present

## 2018-12-17 DIAGNOSIS — G309 Alzheimer's disease, unspecified: Secondary | ICD-10-CM | POA: Diagnosis not present

## 2018-12-17 DIAGNOSIS — Z7902 Long term (current) use of antithrombotics/antiplatelets: Secondary | ICD-10-CM | POA: Diagnosis not present

## 2018-12-17 DIAGNOSIS — Z87442 Personal history of urinary calculi: Secondary | ICD-10-CM | POA: Diagnosis not present

## 2018-12-17 DIAGNOSIS — M1990 Unspecified osteoarthritis, unspecified site: Secondary | ICD-10-CM | POA: Diagnosis not present

## 2018-12-17 DIAGNOSIS — Z87891 Personal history of nicotine dependence: Secondary | ICD-10-CM | POA: Diagnosis not present

## 2018-12-17 DIAGNOSIS — R279 Unspecified lack of coordination: Secondary | ICD-10-CM | POA: Diagnosis not present

## 2018-12-17 DIAGNOSIS — E785 Hyperlipidemia, unspecified: Secondary | ICD-10-CM | POA: Diagnosis not present

## 2018-12-18 DIAGNOSIS — E785 Hyperlipidemia, unspecified: Secondary | ICD-10-CM | POA: Diagnosis not present

## 2018-12-18 DIAGNOSIS — R279 Unspecified lack of coordination: Secondary | ICD-10-CM | POA: Diagnosis not present

## 2018-12-18 DIAGNOSIS — Z9181 History of falling: Secondary | ICD-10-CM | POA: Diagnosis not present

## 2018-12-18 DIAGNOSIS — Z87442 Personal history of urinary calculi: Secondary | ICD-10-CM | POA: Diagnosis not present

## 2018-12-18 DIAGNOSIS — K219 Gastro-esophageal reflux disease without esophagitis: Secondary | ICD-10-CM | POA: Diagnosis not present

## 2018-12-18 DIAGNOSIS — Z87891 Personal history of nicotine dependence: Secondary | ICD-10-CM | POA: Diagnosis not present

## 2018-12-18 DIAGNOSIS — I129 Hypertensive chronic kidney disease with stage 1 through stage 4 chronic kidney disease, or unspecified chronic kidney disease: Secondary | ICD-10-CM | POA: Diagnosis not present

## 2018-12-18 DIAGNOSIS — I69320 Aphasia following cerebral infarction: Secondary | ICD-10-CM | POA: Diagnosis not present

## 2018-12-18 DIAGNOSIS — G309 Alzheimer's disease, unspecified: Secondary | ICD-10-CM | POA: Diagnosis not present

## 2018-12-18 DIAGNOSIS — M1990 Unspecified osteoarthritis, unspecified site: Secondary | ICD-10-CM | POA: Diagnosis not present

## 2018-12-18 DIAGNOSIS — I951 Orthostatic hypotension: Secondary | ICD-10-CM | POA: Diagnosis not present

## 2018-12-18 DIAGNOSIS — M6281 Muscle weakness (generalized): Secondary | ICD-10-CM | POA: Diagnosis not present

## 2018-12-18 DIAGNOSIS — Z7902 Long term (current) use of antithrombotics/antiplatelets: Secondary | ICD-10-CM | POA: Diagnosis not present

## 2018-12-18 DIAGNOSIS — H53421 Scotoma of blind spot area, right eye: Secondary | ICD-10-CM | POA: Diagnosis not present

## 2018-12-18 DIAGNOSIS — N189 Chronic kidney disease, unspecified: Secondary | ICD-10-CM | POA: Diagnosis not present

## 2018-12-18 DIAGNOSIS — Z7982 Long term (current) use of aspirin: Secondary | ICD-10-CM | POA: Diagnosis not present

## 2018-12-18 DIAGNOSIS — H9190 Unspecified hearing loss, unspecified ear: Secondary | ICD-10-CM | POA: Diagnosis not present

## 2018-12-18 DIAGNOSIS — I69398 Other sequelae of cerebral infarction: Secondary | ICD-10-CM | POA: Diagnosis not present

## 2018-12-22 DIAGNOSIS — H53421 Scotoma of blind spot area, right eye: Secondary | ICD-10-CM | POA: Diagnosis not present

## 2018-12-22 DIAGNOSIS — I69398 Other sequelae of cerebral infarction: Secondary | ICD-10-CM | POA: Diagnosis not present

## 2018-12-22 DIAGNOSIS — M6281 Muscle weakness (generalized): Secondary | ICD-10-CM | POA: Diagnosis not present

## 2018-12-22 DIAGNOSIS — Z87442 Personal history of urinary calculi: Secondary | ICD-10-CM | POA: Diagnosis not present

## 2018-12-22 DIAGNOSIS — M1990 Unspecified osteoarthritis, unspecified site: Secondary | ICD-10-CM | POA: Diagnosis not present

## 2018-12-22 DIAGNOSIS — I69320 Aphasia following cerebral infarction: Secondary | ICD-10-CM | POA: Diagnosis not present

## 2018-12-22 DIAGNOSIS — I951 Orthostatic hypotension: Secondary | ICD-10-CM | POA: Diagnosis not present

## 2018-12-22 DIAGNOSIS — E785 Hyperlipidemia, unspecified: Secondary | ICD-10-CM | POA: Diagnosis not present

## 2018-12-22 DIAGNOSIS — H9190 Unspecified hearing loss, unspecified ear: Secondary | ICD-10-CM | POA: Diagnosis not present

## 2018-12-22 DIAGNOSIS — R279 Unspecified lack of coordination: Secondary | ICD-10-CM | POA: Diagnosis not present

## 2018-12-22 DIAGNOSIS — G309 Alzheimer's disease, unspecified: Secondary | ICD-10-CM | POA: Diagnosis not present

## 2018-12-22 DIAGNOSIS — N189 Chronic kidney disease, unspecified: Secondary | ICD-10-CM | POA: Diagnosis not present

## 2018-12-22 DIAGNOSIS — Z9181 History of falling: Secondary | ICD-10-CM | POA: Diagnosis not present

## 2018-12-22 DIAGNOSIS — Z7982 Long term (current) use of aspirin: Secondary | ICD-10-CM | POA: Diagnosis not present

## 2018-12-22 DIAGNOSIS — I129 Hypertensive chronic kidney disease with stage 1 through stage 4 chronic kidney disease, or unspecified chronic kidney disease: Secondary | ICD-10-CM | POA: Diagnosis not present

## 2018-12-22 DIAGNOSIS — Z87891 Personal history of nicotine dependence: Secondary | ICD-10-CM | POA: Diagnosis not present

## 2018-12-22 DIAGNOSIS — Z7902 Long term (current) use of antithrombotics/antiplatelets: Secondary | ICD-10-CM | POA: Diagnosis not present

## 2018-12-22 DIAGNOSIS — K219 Gastro-esophageal reflux disease without esophagitis: Secondary | ICD-10-CM | POA: Diagnosis not present

## 2019-01-05 DIAGNOSIS — M79671 Pain in right foot: Secondary | ICD-10-CM | POA: Diagnosis not present

## 2019-01-05 DIAGNOSIS — R200 Anesthesia of skin: Secondary | ICD-10-CM | POA: Diagnosis not present

## 2019-01-07 DIAGNOSIS — I639 Cerebral infarction, unspecified: Secondary | ICD-10-CM | POA: Diagnosis not present

## 2019-01-12 DIAGNOSIS — I10 Essential (primary) hypertension: Secondary | ICD-10-CM | POA: Diagnosis not present

## 2019-01-12 DIAGNOSIS — I639 Cerebral infarction, unspecified: Secondary | ICD-10-CM | POA: Diagnosis not present

## 2019-01-12 DIAGNOSIS — G309 Alzheimer's disease, unspecified: Secondary | ICD-10-CM | POA: Diagnosis not present

## 2019-01-12 DIAGNOSIS — I69351 Hemiplegia and hemiparesis following cerebral infarction affecting right dominant side: Secondary | ICD-10-CM | POA: Diagnosis not present

## 2019-01-19 DIAGNOSIS — I10 Essential (primary) hypertension: Secondary | ICD-10-CM | POA: Diagnosis not present

## 2019-01-19 DIAGNOSIS — R200 Anesthesia of skin: Secondary | ICD-10-CM | POA: Diagnosis not present

## 2019-01-19 DIAGNOSIS — G309 Alzheimer's disease, unspecified: Secondary | ICD-10-CM | POA: Diagnosis not present

## 2019-01-19 DIAGNOSIS — I69351 Hemiplegia and hemiparesis following cerebral infarction affecting right dominant side: Secondary | ICD-10-CM | POA: Diagnosis not present

## 2019-01-19 DIAGNOSIS — R202 Paresthesia of skin: Secondary | ICD-10-CM | POA: Diagnosis not present

## 2019-01-21 DIAGNOSIS — D519 Vitamin B12 deficiency anemia, unspecified: Secondary | ICD-10-CM | POA: Diagnosis not present

## 2019-01-21 DIAGNOSIS — E039 Hypothyroidism, unspecified: Secondary | ICD-10-CM | POA: Diagnosis not present

## 2019-01-21 DIAGNOSIS — I10 Essential (primary) hypertension: Secondary | ICD-10-CM | POA: Diagnosis not present

## 2019-01-26 DIAGNOSIS — I10 Essential (primary) hypertension: Secondary | ICD-10-CM | POA: Diagnosis not present

## 2019-01-26 DIAGNOSIS — G309 Alzheimer's disease, unspecified: Secondary | ICD-10-CM | POA: Diagnosis not present

## 2019-01-26 DIAGNOSIS — I69351 Hemiplegia and hemiparesis following cerebral infarction affecting right dominant side: Secondary | ICD-10-CM | POA: Diagnosis not present

## 2019-01-26 DIAGNOSIS — I639 Cerebral infarction, unspecified: Secondary | ICD-10-CM | POA: Diagnosis not present

## 2019-02-07 DIAGNOSIS — I639 Cerebral infarction, unspecified: Secondary | ICD-10-CM | POA: Diagnosis not present

## 2019-03-08 DIAGNOSIS — I639 Cerebral infarction, unspecified: Secondary | ICD-10-CM | POA: Diagnosis not present

## 2019-03-16 DIAGNOSIS — I10 Essential (primary) hypertension: Secondary | ICD-10-CM | POA: Diagnosis not present

## 2019-03-16 DIAGNOSIS — I69351 Hemiplegia and hemiparesis following cerebral infarction affecting right dominant side: Secondary | ICD-10-CM | POA: Diagnosis not present

## 2019-03-16 DIAGNOSIS — I639 Cerebral infarction, unspecified: Secondary | ICD-10-CM | POA: Diagnosis not present

## 2019-03-23 DIAGNOSIS — I639 Cerebral infarction, unspecified: Secondary | ICD-10-CM | POA: Diagnosis not present

## 2019-03-23 DIAGNOSIS — G309 Alzheimer's disease, unspecified: Secondary | ICD-10-CM | POA: Diagnosis not present

## 2019-03-23 DIAGNOSIS — I10 Essential (primary) hypertension: Secondary | ICD-10-CM | POA: Diagnosis not present

## 2019-03-31 ENCOUNTER — Ambulatory Visit: Payer: Medicare Other | Admitting: Family Medicine

## 2019-04-08 DIAGNOSIS — I639 Cerebral infarction, unspecified: Secondary | ICD-10-CM | POA: Diagnosis not present

## 2019-04-20 DIAGNOSIS — I10 Essential (primary) hypertension: Secondary | ICD-10-CM | POA: Diagnosis not present

## 2019-04-20 DIAGNOSIS — R4701 Aphasia: Secondary | ICD-10-CM | POA: Diagnosis not present

## 2019-04-20 DIAGNOSIS — I69351 Hemiplegia and hemiparesis following cerebral infarction affecting right dominant side: Secondary | ICD-10-CM | POA: Diagnosis not present

## 2019-04-20 DIAGNOSIS — G309 Alzheimer's disease, unspecified: Secondary | ICD-10-CM | POA: Diagnosis not present

## 2019-05-08 DIAGNOSIS — I639 Cerebral infarction, unspecified: Secondary | ICD-10-CM | POA: Diagnosis not present

## 2019-06-08 DIAGNOSIS — I639 Cerebral infarction, unspecified: Secondary | ICD-10-CM | POA: Diagnosis not present

## 2019-06-10 DIAGNOSIS — I10 Essential (primary) hypertension: Secondary | ICD-10-CM | POA: Diagnosis not present

## 2019-06-10 DIAGNOSIS — D519 Vitamin B12 deficiency anemia, unspecified: Secondary | ICD-10-CM | POA: Diagnosis not present

## 2019-06-10 DIAGNOSIS — E039 Hypothyroidism, unspecified: Secondary | ICD-10-CM | POA: Diagnosis not present

## 2019-06-14 DIAGNOSIS — Z87891 Personal history of nicotine dependence: Secondary | ICD-10-CM | POA: Diagnosis not present

## 2019-06-14 DIAGNOSIS — M4854XG Collapsed vertebra, not elsewhere classified, thoracic region, subsequent encounter for fracture with delayed healing: Secondary | ICD-10-CM | POA: Diagnosis not present

## 2019-06-14 DIAGNOSIS — K219 Gastro-esophageal reflux disease without esophagitis: Secondary | ICD-10-CM | POA: Diagnosis not present

## 2019-06-14 DIAGNOSIS — R1012 Left upper quadrant pain: Secondary | ICD-10-CM | POA: Diagnosis not present

## 2019-06-14 DIAGNOSIS — K820 Obstruction of gallbladder: Secondary | ICD-10-CM | POA: Diagnosis not present

## 2019-06-14 DIAGNOSIS — R9431 Abnormal electrocardiogram [ECG] [EKG]: Secondary | ICD-10-CM | POA: Diagnosis not present

## 2019-06-14 DIAGNOSIS — R1013 Epigastric pain: Secondary | ICD-10-CM | POA: Diagnosis not present

## 2019-06-14 DIAGNOSIS — S22088D Other fracture of T11-T12 vertebra, subsequent encounter for fracture with routine healing: Secondary | ICD-10-CM | POA: Diagnosis not present

## 2019-06-14 DIAGNOSIS — M1990 Unspecified osteoarthritis, unspecified site: Secondary | ICD-10-CM | POA: Diagnosis not present

## 2019-06-14 DIAGNOSIS — M62838 Other muscle spasm: Secondary | ICD-10-CM | POA: Diagnosis not present

## 2019-06-14 DIAGNOSIS — K8689 Other specified diseases of pancreas: Secondary | ICD-10-CM | POA: Diagnosis not present

## 2019-06-14 DIAGNOSIS — Z7902 Long term (current) use of antithrombotics/antiplatelets: Secondary | ICD-10-CM | POA: Diagnosis not present

## 2019-06-14 DIAGNOSIS — R0689 Other abnormalities of breathing: Secondary | ICD-10-CM | POA: Diagnosis not present

## 2019-06-14 DIAGNOSIS — R0989 Other specified symptoms and signs involving the circulatory and respiratory systems: Secondary | ICD-10-CM | POA: Diagnosis not present

## 2019-06-14 DIAGNOSIS — I10 Essential (primary) hypertension: Secondary | ICD-10-CM | POA: Diagnosis not present

## 2019-06-14 DIAGNOSIS — R001 Bradycardia, unspecified: Secondary | ICD-10-CM | POA: Diagnosis not present

## 2019-06-14 DIAGNOSIS — Z7983 Long term (current) use of bisphosphonates: Secondary | ICD-10-CM | POA: Diagnosis not present

## 2019-06-14 DIAGNOSIS — Z7982 Long term (current) use of aspirin: Secondary | ICD-10-CM | POA: Diagnosis not present

## 2019-06-14 DIAGNOSIS — R1084 Generalized abdominal pain: Secondary | ICD-10-CM | POA: Diagnosis not present

## 2019-06-14 DIAGNOSIS — R933 Abnormal findings on diagnostic imaging of other parts of digestive tract: Secondary | ICD-10-CM | POA: Diagnosis not present

## 2019-06-14 DIAGNOSIS — N281 Cyst of kidney, acquired: Secondary | ICD-10-CM | POA: Diagnosis not present

## 2019-06-14 DIAGNOSIS — Z888 Allergy status to other drugs, medicaments and biological substances status: Secondary | ICD-10-CM | POA: Diagnosis not present

## 2019-06-14 DIAGNOSIS — Z91048 Other nonmedicinal substance allergy status: Secondary | ICD-10-CM | POA: Diagnosis not present

## 2019-06-14 DIAGNOSIS — Z79899 Other long term (current) drug therapy: Secondary | ICD-10-CM | POA: Diagnosis not present

## 2019-06-14 DIAGNOSIS — E785 Hyperlipidemia, unspecified: Secondary | ICD-10-CM | POA: Diagnosis not present

## 2019-06-14 DIAGNOSIS — Z8673 Personal history of transient ischemic attack (TIA), and cerebral infarction without residual deficits: Secondary | ICD-10-CM | POA: Diagnosis not present

## 2019-06-15 DIAGNOSIS — I639 Cerebral infarction, unspecified: Secondary | ICD-10-CM | POA: Diagnosis not present

## 2019-06-15 DIAGNOSIS — R4701 Aphasia: Secondary | ICD-10-CM | POA: Diagnosis not present

## 2019-06-15 DIAGNOSIS — I10 Essential (primary) hypertension: Secondary | ICD-10-CM | POA: Diagnosis not present

## 2019-06-15 DIAGNOSIS — G309 Alzheimer's disease, unspecified: Secondary | ICD-10-CM | POA: Diagnosis not present

## 2019-07-08 DIAGNOSIS — I639 Cerebral infarction, unspecified: Secondary | ICD-10-CM | POA: Diagnosis not present

## 2019-07-15 DIAGNOSIS — I679 Cerebrovascular disease, unspecified: Secondary | ICD-10-CM | POA: Diagnosis not present

## 2019-07-15 DIAGNOSIS — R200 Anesthesia of skin: Secondary | ICD-10-CM | POA: Diagnosis not present

## 2019-08-08 DIAGNOSIS — I639 Cerebral infarction, unspecified: Secondary | ICD-10-CM | POA: Diagnosis not present

## 2019-08-26 DIAGNOSIS — I10 Essential (primary) hypertension: Secondary | ICD-10-CM | POA: Diagnosis not present

## 2019-08-26 DIAGNOSIS — D519 Vitamin B12 deficiency anemia, unspecified: Secondary | ICD-10-CM | POA: Diagnosis not present

## 2019-08-26 DIAGNOSIS — E039 Hypothyroidism, unspecified: Secondary | ICD-10-CM | POA: Diagnosis not present

## 2020-01-06 MED ORDER — CALCIUM CARBONATE-VITAMIN D 600-400 MG-UNIT PO TABS
1.00 | ORAL_TABLET | ORAL | Status: DC
Start: 2020-01-06 — End: 2020-01-06

## 2020-01-06 MED ORDER — SODIUM CHLORIDE 0.9 % IV SOLN
10.00 | INTRAVENOUS | Status: DC
Start: ? — End: 2020-01-06

## 2020-01-06 MED ORDER — GENERIC EXTERNAL MEDICATION
Status: DC
Start: ? — End: 2020-01-06

## 2020-01-06 MED ORDER — ACETAMINOPHEN 325 MG PO TABS
650.00 | ORAL_TABLET | ORAL | Status: DC
Start: ? — End: 2020-01-06

## 2020-01-06 MED ORDER — CLOPIDOGREL BISULFATE 75 MG PO TABS
75.00 | ORAL_TABLET | ORAL | Status: DC
Start: 2020-01-06 — End: 2020-01-06

## 2020-01-06 MED ORDER — CYANOCOBALAMIN 1000 MCG PO TABS
1000.00 | ORAL_TABLET | ORAL | Status: DC
Start: 2020-01-06 — End: 2020-01-06

## 2020-01-06 MED ORDER — LABETALOL HCL 5 MG/ML IV SOLN
10.00 | INTRAVENOUS | Status: DC
Start: ? — End: 2020-01-06

## 2020-01-06 MED ORDER — NITROGLYCERIN 0.4 MG SL SUBL
0.40 | SUBLINGUAL_TABLET | SUBLINGUAL | Status: DC
Start: ? — End: 2020-01-06

## 2020-01-06 MED ORDER — LORAZEPAM 0.5 MG PO TABS
0.25 | ORAL_TABLET | ORAL | Status: DC
Start: ? — End: 2020-01-06

## 2020-01-06 MED ORDER — GABAPENTIN 300 MG PO CAPS
300.00 | ORAL_CAPSULE | ORAL | Status: DC
Start: 2020-01-05 — End: 2020-01-06

## 2020-01-06 MED ORDER — LORAZEPAM 0.5 MG PO TABS
0.25 | ORAL_TABLET | ORAL | Status: DC
Start: 2020-01-06 — End: 2020-01-06

## 2020-01-06 MED ORDER — MAGNESIUM OXIDE 400 MG PO TABS
400.00 | ORAL_TABLET | ORAL | Status: DC
Start: 2020-01-05 — End: 2020-01-06

## 2020-01-06 MED ORDER — PANTOPRAZOLE SODIUM 40 MG PO TBEC
40.00 | DELAYED_RELEASE_TABLET | ORAL | Status: DC
Start: 2020-01-06 — End: 2020-01-06

## 2020-01-06 MED ORDER — DSS 100 MG PO CAPS
100.00 | ORAL_CAPSULE | ORAL | Status: DC
Start: 2020-01-05 — End: 2020-01-06

## 2020-01-06 MED ORDER — AMLODIPINE BESYLATE 2.5 MG PO TABS
2.50 | ORAL_TABLET | ORAL | Status: DC
Start: ? — End: 2020-01-06

## 2020-01-06 MED ORDER — DONEPEZIL HCL 5 MG PO TABS
5.00 | ORAL_TABLET | ORAL | Status: DC
Start: 2020-01-05 — End: 2020-01-06

## 2020-01-31 DIAGNOSIS — K5792 Diverticulitis of intestine, part unspecified, without perforation or abscess without bleeding: Secondary | ICD-10-CM | POA: Diagnosis not present

## 2020-01-31 DIAGNOSIS — K219 Gastro-esophageal reflux disease without esophagitis: Secondary | ICD-10-CM | POA: Diagnosis not present

## 2020-01-31 DIAGNOSIS — Z8673 Personal history of transient ischemic attack (TIA), and cerebral infarction without residual deficits: Secondary | ICD-10-CM | POA: Diagnosis not present

## 2020-01-31 DIAGNOSIS — N139 Obstructive and reflux uropathy, unspecified: Secondary | ICD-10-CM | POA: Diagnosis not present

## 2020-01-31 DIAGNOSIS — Z87891 Personal history of nicotine dependence: Secondary | ICD-10-CM | POA: Diagnosis not present

## 2020-01-31 DIAGNOSIS — I6521 Occlusion and stenosis of right carotid artery: Secondary | ICD-10-CM | POA: Diagnosis not present

## 2020-01-31 DIAGNOSIS — Z7902 Long term (current) use of antithrombotics/antiplatelets: Secondary | ICD-10-CM | POA: Diagnosis not present

## 2020-01-31 DIAGNOSIS — E785 Hyperlipidemia, unspecified: Secondary | ICD-10-CM | POA: Diagnosis not present

## 2020-01-31 DIAGNOSIS — J439 Emphysema, unspecified: Secondary | ICD-10-CM | POA: Diagnosis not present

## 2020-01-31 DIAGNOSIS — G309 Alzheimer's disease, unspecified: Secondary | ICD-10-CM | POA: Diagnosis not present

## 2020-01-31 DIAGNOSIS — E860 Dehydration: Secondary | ICD-10-CM | POA: Diagnosis not present

## 2020-01-31 DIAGNOSIS — I129 Hypertensive chronic kidney disease with stage 1 through stage 4 chronic kidney disease, or unspecified chronic kidney disease: Secondary | ICD-10-CM | POA: Diagnosis not present

## 2020-01-31 DIAGNOSIS — N1831 Chronic kidney disease, stage 3a: Secondary | ICD-10-CM | POA: Diagnosis not present

## 2020-02-01 DIAGNOSIS — I10 Essential (primary) hypertension: Secondary | ICD-10-CM | POA: Diagnosis not present

## 2020-02-01 DIAGNOSIS — G309 Alzheimer's disease, unspecified: Secondary | ICD-10-CM | POA: Diagnosis not present

## 2020-02-02 DIAGNOSIS — E785 Hyperlipidemia, unspecified: Secondary | ICD-10-CM | POA: Diagnosis not present

## 2020-02-02 DIAGNOSIS — I6521 Occlusion and stenosis of right carotid artery: Secondary | ICD-10-CM | POA: Diagnosis not present

## 2020-02-02 DIAGNOSIS — Z7902 Long term (current) use of antithrombotics/antiplatelets: Secondary | ICD-10-CM | POA: Diagnosis not present

## 2020-02-02 DIAGNOSIS — I129 Hypertensive chronic kidney disease with stage 1 through stage 4 chronic kidney disease, or unspecified chronic kidney disease: Secondary | ICD-10-CM | POA: Diagnosis not present

## 2020-02-02 DIAGNOSIS — E860 Dehydration: Secondary | ICD-10-CM | POA: Diagnosis not present

## 2020-02-02 DIAGNOSIS — K219 Gastro-esophageal reflux disease without esophagitis: Secondary | ICD-10-CM | POA: Diagnosis not present

## 2020-02-02 DIAGNOSIS — G309 Alzheimer's disease, unspecified: Secondary | ICD-10-CM | POA: Diagnosis not present

## 2020-02-02 DIAGNOSIS — N1831 Chronic kidney disease, stage 3a: Secondary | ICD-10-CM | POA: Diagnosis not present

## 2020-02-02 DIAGNOSIS — Z8673 Personal history of transient ischemic attack (TIA), and cerebral infarction without residual deficits: Secondary | ICD-10-CM | POA: Diagnosis not present

## 2020-02-02 DIAGNOSIS — N139 Obstructive and reflux uropathy, unspecified: Secondary | ICD-10-CM | POA: Diagnosis not present

## 2020-02-02 DIAGNOSIS — Z87891 Personal history of nicotine dependence: Secondary | ICD-10-CM | POA: Diagnosis not present

## 2020-02-02 DIAGNOSIS — K5792 Diverticulitis of intestine, part unspecified, without perforation or abscess without bleeding: Secondary | ICD-10-CM | POA: Diagnosis not present

## 2020-02-02 DIAGNOSIS — J439 Emphysema, unspecified: Secondary | ICD-10-CM | POA: Diagnosis not present

## 2020-02-04 DIAGNOSIS — Z1159 Encounter for screening for other viral diseases: Secondary | ICD-10-CM | POA: Diagnosis not present

## 2020-02-04 DIAGNOSIS — K5792 Diverticulitis of intestine, part unspecified, without perforation or abscess without bleeding: Secondary | ICD-10-CM | POA: Diagnosis not present

## 2020-02-04 DIAGNOSIS — I6521 Occlusion and stenosis of right carotid artery: Secondary | ICD-10-CM | POA: Diagnosis not present

## 2020-02-04 DIAGNOSIS — N1831 Chronic kidney disease, stage 3a: Secondary | ICD-10-CM | POA: Diagnosis not present

## 2020-02-04 DIAGNOSIS — E785 Hyperlipidemia, unspecified: Secondary | ICD-10-CM | POA: Diagnosis not present

## 2020-02-04 DIAGNOSIS — G309 Alzheimer's disease, unspecified: Secondary | ICD-10-CM | POA: Diagnosis not present

## 2020-02-04 DIAGNOSIS — E860 Dehydration: Secondary | ICD-10-CM | POA: Diagnosis not present

## 2020-02-04 DIAGNOSIS — I129 Hypertensive chronic kidney disease with stage 1 through stage 4 chronic kidney disease, or unspecified chronic kidney disease: Secondary | ICD-10-CM | POA: Diagnosis not present

## 2020-02-04 DIAGNOSIS — J439 Emphysema, unspecified: Secondary | ICD-10-CM | POA: Diagnosis not present

## 2020-02-04 DIAGNOSIS — K219 Gastro-esophageal reflux disease without esophagitis: Secondary | ICD-10-CM | POA: Diagnosis not present

## 2020-02-04 DIAGNOSIS — Z87891 Personal history of nicotine dependence: Secondary | ICD-10-CM | POA: Diagnosis not present

## 2020-02-04 DIAGNOSIS — Z8673 Personal history of transient ischemic attack (TIA), and cerebral infarction without residual deficits: Secondary | ICD-10-CM | POA: Diagnosis not present

## 2020-02-04 DIAGNOSIS — Z7902 Long term (current) use of antithrombotics/antiplatelets: Secondary | ICD-10-CM | POA: Diagnosis not present

## 2020-02-04 DIAGNOSIS — N139 Obstructive and reflux uropathy, unspecified: Secondary | ICD-10-CM | POA: Diagnosis not present

## 2020-02-07 DIAGNOSIS — N139 Obstructive and reflux uropathy, unspecified: Secondary | ICD-10-CM | POA: Diagnosis not present

## 2020-02-07 DIAGNOSIS — I6521 Occlusion and stenosis of right carotid artery: Secondary | ICD-10-CM | POA: Diagnosis not present

## 2020-02-07 DIAGNOSIS — Z7902 Long term (current) use of antithrombotics/antiplatelets: Secondary | ICD-10-CM | POA: Diagnosis not present

## 2020-02-07 DIAGNOSIS — G309 Alzheimer's disease, unspecified: Secondary | ICD-10-CM | POA: Diagnosis not present

## 2020-02-07 DIAGNOSIS — I129 Hypertensive chronic kidney disease with stage 1 through stage 4 chronic kidney disease, or unspecified chronic kidney disease: Secondary | ICD-10-CM | POA: Diagnosis not present

## 2020-02-07 DIAGNOSIS — N1831 Chronic kidney disease, stage 3a: Secondary | ICD-10-CM | POA: Diagnosis not present

## 2020-02-07 DIAGNOSIS — K219 Gastro-esophageal reflux disease without esophagitis: Secondary | ICD-10-CM | POA: Diagnosis not present

## 2020-02-07 DIAGNOSIS — E860 Dehydration: Secondary | ICD-10-CM | POA: Diagnosis not present

## 2020-02-07 DIAGNOSIS — Z8673 Personal history of transient ischemic attack (TIA), and cerebral infarction without residual deficits: Secondary | ICD-10-CM | POA: Diagnosis not present

## 2020-02-07 DIAGNOSIS — J439 Emphysema, unspecified: Secondary | ICD-10-CM | POA: Diagnosis not present

## 2020-02-07 DIAGNOSIS — Z87891 Personal history of nicotine dependence: Secondary | ICD-10-CM | POA: Diagnosis not present

## 2020-02-07 DIAGNOSIS — K5792 Diverticulitis of intestine, part unspecified, without perforation or abscess without bleeding: Secondary | ICD-10-CM | POA: Diagnosis not present

## 2020-02-07 DIAGNOSIS — E785 Hyperlipidemia, unspecified: Secondary | ICD-10-CM | POA: Diagnosis not present

## 2020-02-08 DIAGNOSIS — Z1159 Encounter for screening for other viral diseases: Secondary | ICD-10-CM | POA: Diagnosis not present

## 2020-02-12 DIAGNOSIS — Z1159 Encounter for screening for other viral diseases: Secondary | ICD-10-CM | POA: Diagnosis not present

## 2020-02-14 DIAGNOSIS — K219 Gastro-esophageal reflux disease without esophagitis: Secondary | ICD-10-CM | POA: Diagnosis not present

## 2020-02-14 DIAGNOSIS — J439 Emphysema, unspecified: Secondary | ICD-10-CM | POA: Diagnosis not present

## 2020-02-14 DIAGNOSIS — E785 Hyperlipidemia, unspecified: Secondary | ICD-10-CM | POA: Diagnosis not present

## 2020-02-14 DIAGNOSIS — N1831 Chronic kidney disease, stage 3a: Secondary | ICD-10-CM | POA: Diagnosis not present

## 2020-02-14 DIAGNOSIS — I129 Hypertensive chronic kidney disease with stage 1 through stage 4 chronic kidney disease, or unspecified chronic kidney disease: Secondary | ICD-10-CM | POA: Diagnosis not present

## 2020-02-14 DIAGNOSIS — Z87891 Personal history of nicotine dependence: Secondary | ICD-10-CM | POA: Diagnosis not present

## 2020-02-14 DIAGNOSIS — G309 Alzheimer's disease, unspecified: Secondary | ICD-10-CM | POA: Diagnosis not present

## 2020-02-14 DIAGNOSIS — K5792 Diverticulitis of intestine, part unspecified, without perforation or abscess without bleeding: Secondary | ICD-10-CM | POA: Diagnosis not present

## 2020-02-14 DIAGNOSIS — I6521 Occlusion and stenosis of right carotid artery: Secondary | ICD-10-CM | POA: Diagnosis not present

## 2020-02-14 DIAGNOSIS — Z7902 Long term (current) use of antithrombotics/antiplatelets: Secondary | ICD-10-CM | POA: Diagnosis not present

## 2020-02-14 DIAGNOSIS — Z8673 Personal history of transient ischemic attack (TIA), and cerebral infarction without residual deficits: Secondary | ICD-10-CM | POA: Diagnosis not present

## 2020-02-14 DIAGNOSIS — N139 Obstructive and reflux uropathy, unspecified: Secondary | ICD-10-CM | POA: Diagnosis not present

## 2020-02-14 DIAGNOSIS — E860 Dehydration: Secondary | ICD-10-CM | POA: Diagnosis not present

## 2020-02-16 DIAGNOSIS — Z1159 Encounter for screening for other viral diseases: Secondary | ICD-10-CM | POA: Diagnosis not present

## 2020-02-24 DIAGNOSIS — E612 Magnesium deficiency: Secondary | ICD-10-CM | POA: Diagnosis not present

## 2020-02-24 DIAGNOSIS — I10 Essential (primary) hypertension: Secondary | ICD-10-CM | POA: Diagnosis not present

## 2020-03-06 DIAGNOSIS — K219 Gastro-esophageal reflux disease without esophagitis: Secondary | ICD-10-CM | POA: Diagnosis not present

## 2020-03-06 DIAGNOSIS — Z87891 Personal history of nicotine dependence: Secondary | ICD-10-CM | POA: Diagnosis not present

## 2020-03-06 DIAGNOSIS — E860 Dehydration: Secondary | ICD-10-CM | POA: Diagnosis not present

## 2020-03-06 DIAGNOSIS — E785 Hyperlipidemia, unspecified: Secondary | ICD-10-CM | POA: Diagnosis not present

## 2020-03-06 DIAGNOSIS — Z7902 Long term (current) use of antithrombotics/antiplatelets: Secondary | ICD-10-CM | POA: Diagnosis not present

## 2020-03-06 DIAGNOSIS — Z8673 Personal history of transient ischemic attack (TIA), and cerebral infarction without residual deficits: Secondary | ICD-10-CM | POA: Diagnosis not present

## 2020-03-06 DIAGNOSIS — N139 Obstructive and reflux uropathy, unspecified: Secondary | ICD-10-CM | POA: Diagnosis not present

## 2020-03-06 DIAGNOSIS — I129 Hypertensive chronic kidney disease with stage 1 through stage 4 chronic kidney disease, or unspecified chronic kidney disease: Secondary | ICD-10-CM | POA: Diagnosis not present

## 2020-03-06 DIAGNOSIS — K5792 Diverticulitis of intestine, part unspecified, without perforation or abscess without bleeding: Secondary | ICD-10-CM | POA: Diagnosis not present

## 2020-03-06 DIAGNOSIS — J439 Emphysema, unspecified: Secondary | ICD-10-CM | POA: Diagnosis not present

## 2020-03-06 DIAGNOSIS — N1831 Chronic kidney disease, stage 3a: Secondary | ICD-10-CM | POA: Diagnosis not present

## 2020-03-06 DIAGNOSIS — G309 Alzheimer's disease, unspecified: Secondary | ICD-10-CM | POA: Diagnosis not present

## 2020-03-06 DIAGNOSIS — I6521 Occlusion and stenosis of right carotid artery: Secondary | ICD-10-CM | POA: Diagnosis not present

## 2020-03-26 DIAGNOSIS — I10 Essential (primary) hypertension: Secondary | ICD-10-CM | POA: Diagnosis not present

## 2020-03-26 DIAGNOSIS — I679 Cerebrovascular disease, unspecified: Secondary | ICD-10-CM | POA: Diagnosis not present

## 2020-03-26 DIAGNOSIS — G301 Alzheimer's disease with late onset: Secondary | ICD-10-CM | POA: Diagnosis not present

## 2020-03-30 DIAGNOSIS — Z1159 Encounter for screening for other viral diseases: Secondary | ICD-10-CM | POA: Diagnosis not present

## 2020-04-07 DIAGNOSIS — Z1159 Encounter for screening for other viral diseases: Secondary | ICD-10-CM | POA: Diagnosis not present

## 2020-04-13 DIAGNOSIS — Z1159 Encounter for screening for other viral diseases: Secondary | ICD-10-CM | POA: Diagnosis not present

## 2020-04-19 DIAGNOSIS — Z1159 Encounter for screening for other viral diseases: Secondary | ICD-10-CM | POA: Diagnosis not present

## 2020-05-04 DIAGNOSIS — Z1159 Encounter for screening for other viral diseases: Secondary | ICD-10-CM | POA: Diagnosis not present

## 2020-05-11 DIAGNOSIS — Z1159 Encounter for screening for other viral diseases: Secondary | ICD-10-CM | POA: Diagnosis not present

## 2020-05-16 DIAGNOSIS — K219 Gastro-esophageal reflux disease without esophagitis: Secondary | ICD-10-CM | POA: Diagnosis not present

## 2020-05-16 DIAGNOSIS — G301 Alzheimer's disease with late onset: Secondary | ICD-10-CM | POA: Diagnosis not present

## 2020-05-16 DIAGNOSIS — K30 Functional dyspepsia: Secondary | ICD-10-CM | POA: Diagnosis not present

## 2020-05-18 DIAGNOSIS — E612 Magnesium deficiency: Secondary | ICD-10-CM | POA: Diagnosis not present

## 2020-05-18 DIAGNOSIS — Z79899 Other long term (current) drug therapy: Secondary | ICD-10-CM | POA: Diagnosis not present

## 2020-05-18 DIAGNOSIS — E039 Hypothyroidism, unspecified: Secondary | ICD-10-CM | POA: Diagnosis not present

## 2020-05-19 DIAGNOSIS — Z1159 Encounter for screening for other viral diseases: Secondary | ICD-10-CM | POA: Diagnosis not present

## 2020-05-26 DIAGNOSIS — Z1159 Encounter for screening for other viral diseases: Secondary | ICD-10-CM | POA: Diagnosis not present

## 2020-05-31 DIAGNOSIS — Z1159 Encounter for screening for other viral diseases: Secondary | ICD-10-CM | POA: Diagnosis not present

## 2020-06-13 DIAGNOSIS — I10 Essential (primary) hypertension: Secondary | ICD-10-CM | POA: Diagnosis not present

## 2020-06-13 DIAGNOSIS — G301 Alzheimer's disease with late onset: Secondary | ICD-10-CM | POA: Diagnosis not present

## 2020-06-13 DIAGNOSIS — I69351 Hemiplegia and hemiparesis following cerebral infarction affecting right dominant side: Secondary | ICD-10-CM | POA: Diagnosis not present

## 2020-06-20 DIAGNOSIS — G301 Alzheimer's disease with late onset: Secondary | ICD-10-CM | POA: Diagnosis not present

## 2020-06-20 DIAGNOSIS — I10 Essential (primary) hypertension: Secondary | ICD-10-CM | POA: Diagnosis not present

## 2020-06-20 DIAGNOSIS — I679 Cerebrovascular disease, unspecified: Secondary | ICD-10-CM | POA: Diagnosis not present

## 2020-06-22 DIAGNOSIS — E039 Hypothyroidism, unspecified: Secondary | ICD-10-CM | POA: Diagnosis not present

## 2020-06-29 DIAGNOSIS — Z20822 Contact with and (suspected) exposure to covid-19: Secondary | ICD-10-CM | POA: Diagnosis not present

## 2020-07-04 DIAGNOSIS — I69351 Hemiplegia and hemiparesis following cerebral infarction affecting right dominant side: Secondary | ICD-10-CM | POA: Diagnosis not present

## 2020-07-04 DIAGNOSIS — I639 Cerebral infarction, unspecified: Secondary | ICD-10-CM | POA: Diagnosis not present

## 2020-07-04 DIAGNOSIS — I10 Essential (primary) hypertension: Secondary | ICD-10-CM | POA: Diagnosis not present

## 2020-07-06 DIAGNOSIS — Z20822 Contact with and (suspected) exposure to covid-19: Secondary | ICD-10-CM | POA: Diagnosis not present

## 2020-08-08 DIAGNOSIS — I10 Essential (primary) hypertension: Secondary | ICD-10-CM | POA: Diagnosis not present

## 2020-08-08 DIAGNOSIS — G301 Alzheimer's disease with late onset: Secondary | ICD-10-CM | POA: Diagnosis not present

## 2020-08-10 DIAGNOSIS — I10 Essential (primary) hypertension: Secondary | ICD-10-CM | POA: Diagnosis not present

## 2020-08-10 DIAGNOSIS — E612 Magnesium deficiency: Secondary | ICD-10-CM | POA: Diagnosis not present

## 2020-08-10 DIAGNOSIS — E039 Hypothyroidism, unspecified: Secondary | ICD-10-CM | POA: Diagnosis not present

## 2020-08-24 DIAGNOSIS — I700 Atherosclerosis of aorta: Secondary | ICD-10-CM | POA: Diagnosis not present

## 2020-08-29 DIAGNOSIS — I10 Essential (primary) hypertension: Secondary | ICD-10-CM | POA: Diagnosis not present

## 2020-08-29 DIAGNOSIS — I69351 Hemiplegia and hemiparesis following cerebral infarction affecting right dominant side: Secondary | ICD-10-CM | POA: Diagnosis not present

## 2020-08-29 DIAGNOSIS — G301 Alzheimer's disease with late onset: Secondary | ICD-10-CM | POA: Diagnosis not present

## 2020-08-31 DIAGNOSIS — Z20822 Contact with and (suspected) exposure to covid-19: Secondary | ICD-10-CM | POA: Diagnosis not present

## 2020-09-05 DIAGNOSIS — Z20822 Contact with and (suspected) exposure to covid-19: Secondary | ICD-10-CM | POA: Diagnosis not present

## 2020-09-05 DIAGNOSIS — G301 Alzheimer's disease with late onset: Secondary | ICD-10-CM | POA: Diagnosis not present

## 2020-09-05 DIAGNOSIS — I10 Essential (primary) hypertension: Secondary | ICD-10-CM | POA: Diagnosis not present

## 2020-09-05 DIAGNOSIS — R634 Abnormal weight loss: Secondary | ICD-10-CM | POA: Diagnosis not present

## 2020-09-07 DIAGNOSIS — Z20822 Contact with and (suspected) exposure to covid-19: Secondary | ICD-10-CM | POA: Diagnosis not present

## 2020-09-11 DIAGNOSIS — Z20822 Contact with and (suspected) exposure to covid-19: Secondary | ICD-10-CM | POA: Diagnosis not present

## 2020-09-14 DIAGNOSIS — Z20822 Contact with and (suspected) exposure to covid-19: Secondary | ICD-10-CM | POA: Diagnosis not present

## 2020-09-18 DIAGNOSIS — Z20822 Contact with and (suspected) exposure to covid-19: Secondary | ICD-10-CM | POA: Diagnosis not present

## 2020-09-20 DIAGNOSIS — K219 Gastro-esophageal reflux disease without esophagitis: Secondary | ICD-10-CM | POA: Diagnosis not present

## 2020-09-20 DIAGNOSIS — I10 Essential (primary) hypertension: Secondary | ICD-10-CM | POA: Diagnosis not present

## 2020-09-20 DIAGNOSIS — I679 Cerebrovascular disease, unspecified: Secondary | ICD-10-CM | POA: Diagnosis not present

## 2020-09-20 DIAGNOSIS — I69320 Aphasia following cerebral infarction: Secondary | ICD-10-CM | POA: Diagnosis not present

## 2020-09-25 DIAGNOSIS — Z20822 Contact with and (suspected) exposure to covid-19: Secondary | ICD-10-CM | POA: Diagnosis not present

## 2020-09-26 DIAGNOSIS — I10 Essential (primary) hypertension: Secondary | ICD-10-CM | POA: Diagnosis not present

## 2020-09-26 DIAGNOSIS — G301 Alzheimer's disease with late onset: Secondary | ICD-10-CM | POA: Diagnosis not present

## 2020-10-02 DIAGNOSIS — Z20822 Contact with and (suspected) exposure to covid-19: Secondary | ICD-10-CM | POA: Diagnosis not present

## 2020-10-03 DIAGNOSIS — I69351 Hemiplegia and hemiparesis following cerebral infarction affecting right dominant side: Secondary | ICD-10-CM | POA: Diagnosis not present

## 2020-10-03 DIAGNOSIS — I639 Cerebral infarction, unspecified: Secondary | ICD-10-CM | POA: Diagnosis not present

## 2020-10-03 DIAGNOSIS — G301 Alzheimer's disease with late onset: Secondary | ICD-10-CM | POA: Diagnosis not present

## 2020-10-09 DIAGNOSIS — Z20822 Contact with and (suspected) exposure to covid-19: Secondary | ICD-10-CM | POA: Diagnosis not present

## 2020-10-16 DIAGNOSIS — Z20822 Contact with and (suspected) exposure to covid-19: Secondary | ICD-10-CM | POA: Diagnosis not present

## 2020-10-23 DIAGNOSIS — Z20822 Contact with and (suspected) exposure to covid-19: Secondary | ICD-10-CM | POA: Diagnosis not present

## 2020-10-30 DIAGNOSIS — Z20822 Contact with and (suspected) exposure to covid-19: Secondary | ICD-10-CM | POA: Diagnosis not present

## 2020-11-04 DIAGNOSIS — E785 Hyperlipidemia, unspecified: Secondary | ICD-10-CM | POA: Diagnosis not present

## 2020-11-04 DIAGNOSIS — M1991 Primary osteoarthritis, unspecified site: Secondary | ICD-10-CM | POA: Diagnosis not present

## 2020-11-04 DIAGNOSIS — R6883 Chills (without fever): Secondary | ICD-10-CM | POA: Diagnosis not present

## 2020-11-04 DIAGNOSIS — Z87442 Personal history of urinary calculi: Secondary | ICD-10-CM | POA: Diagnosis not present

## 2020-11-04 DIAGNOSIS — K219 Gastro-esophageal reflux disease without esophagitis: Secondary | ICD-10-CM | POA: Diagnosis not present

## 2020-11-04 DIAGNOSIS — Z87891 Personal history of nicotine dependence: Secondary | ICD-10-CM | POA: Diagnosis not present

## 2020-11-04 DIAGNOSIS — Z7902 Long term (current) use of antithrombotics/antiplatelets: Secondary | ICD-10-CM | POA: Diagnosis not present

## 2020-11-04 DIAGNOSIS — R9401 Abnormal electroencephalogram [EEG]: Secondary | ICD-10-CM | POA: Diagnosis not present

## 2020-11-04 DIAGNOSIS — Z91048 Other nonmedicinal substance allergy status: Secondary | ICD-10-CM | POA: Diagnosis not present

## 2020-11-04 DIAGNOSIS — R569 Unspecified convulsions: Secondary | ICD-10-CM | POA: Diagnosis not present

## 2020-11-04 DIAGNOSIS — Z79899 Other long term (current) drug therapy: Secondary | ICD-10-CM | POA: Diagnosis not present

## 2020-11-04 DIAGNOSIS — G459 Transient cerebral ischemic attack, unspecified: Secondary | ICD-10-CM | POA: Diagnosis not present

## 2020-11-04 DIAGNOSIS — Z888 Allergy status to other drugs, medicaments and biological substances status: Secondary | ICD-10-CM | POA: Diagnosis not present

## 2020-11-04 DIAGNOSIS — I10 Essential (primary) hypertension: Secondary | ICD-10-CM | POA: Diagnosis not present

## 2020-11-04 DIAGNOSIS — I639 Cerebral infarction, unspecified: Secondary | ICD-10-CM | POA: Diagnosis not present

## 2020-11-06 DIAGNOSIS — Z20822 Contact with and (suspected) exposure to covid-19: Secondary | ICD-10-CM | POA: Diagnosis not present

## 2020-11-07 DIAGNOSIS — I10 Essential (primary) hypertension: Secondary | ICD-10-CM | POA: Diagnosis not present

## 2020-11-07 DIAGNOSIS — I69351 Hemiplegia and hemiparesis following cerebral infarction affecting right dominant side: Secondary | ICD-10-CM | POA: Diagnosis not present

## 2020-11-07 DIAGNOSIS — G301 Alzheimer's disease with late onset: Secondary | ICD-10-CM | POA: Diagnosis not present

## 2020-11-09 DIAGNOSIS — K219 Gastro-esophageal reflux disease without esophagitis: Secondary | ICD-10-CM | POA: Diagnosis not present

## 2020-11-09 DIAGNOSIS — S0990XA Unspecified injury of head, initial encounter: Secondary | ICD-10-CM | POA: Diagnosis not present

## 2020-11-09 DIAGNOSIS — E785 Hyperlipidemia, unspecified: Secondary | ICD-10-CM | POA: Diagnosis not present

## 2020-11-09 DIAGNOSIS — Z9181 History of falling: Secondary | ICD-10-CM | POA: Diagnosis not present

## 2020-11-09 DIAGNOSIS — Z7983 Long term (current) use of bisphosphonates: Secondary | ICD-10-CM | POA: Diagnosis not present

## 2020-11-09 DIAGNOSIS — W1839XA Other fall on same level, initial encounter: Secondary | ICD-10-CM | POA: Diagnosis not present

## 2020-11-09 DIAGNOSIS — R918 Other nonspecific abnormal finding of lung field: Secondary | ICD-10-CM | POA: Diagnosis not present

## 2020-11-09 DIAGNOSIS — I10 Essential (primary) hypertension: Secondary | ICD-10-CM | POA: Diagnosis not present

## 2020-11-09 DIAGNOSIS — S0003XA Contusion of scalp, initial encounter: Secondary | ICD-10-CM | POA: Diagnosis not present

## 2020-11-09 DIAGNOSIS — Z87891 Personal history of nicotine dependence: Secondary | ICD-10-CM | POA: Diagnosis not present

## 2020-11-09 DIAGNOSIS — W19XXXA Unspecified fall, initial encounter: Secondary | ICD-10-CM | POA: Diagnosis not present

## 2020-11-09 DIAGNOSIS — Z888 Allergy status to other drugs, medicaments and biological substances status: Secondary | ICD-10-CM | POA: Diagnosis not present

## 2020-11-09 DIAGNOSIS — R509 Fever, unspecified: Secondary | ICD-10-CM | POA: Diagnosis not present

## 2020-11-09 DIAGNOSIS — Z79899 Other long term (current) drug therapy: Secondary | ICD-10-CM | POA: Diagnosis not present

## 2020-11-09 DIAGNOSIS — Z9049 Acquired absence of other specified parts of digestive tract: Secondary | ICD-10-CM | POA: Diagnosis not present

## 2020-11-09 DIAGNOSIS — Z8673 Personal history of transient ischemic attack (TIA), and cerebral infarction without residual deficits: Secondary | ICD-10-CM | POA: Diagnosis not present

## 2020-11-09 DIAGNOSIS — Z7902 Long term (current) use of antithrombotics/antiplatelets: Secondary | ICD-10-CM | POA: Diagnosis not present

## 2020-11-09 DIAGNOSIS — I517 Cardiomegaly: Secondary | ICD-10-CM | POA: Diagnosis not present

## 2020-11-09 DIAGNOSIS — Z049 Encounter for examination and observation for unspecified reason: Secondary | ICD-10-CM | POA: Diagnosis not present

## 2020-11-09 DIAGNOSIS — Z91048 Other nonmedicinal substance allergy status: Secondary | ICD-10-CM | POA: Diagnosis not present

## 2020-11-13 DIAGNOSIS — Z20822 Contact with and (suspected) exposure to covid-19: Secondary | ICD-10-CM | POA: Diagnosis not present

## 2020-11-14 DIAGNOSIS — Y92129 Unspecified place in nursing home as the place of occurrence of the external cause: Secondary | ICD-10-CM | POA: Diagnosis not present

## 2020-11-14 DIAGNOSIS — G301 Alzheimer's disease with late onset: Secondary | ICD-10-CM | POA: Diagnosis not present

## 2020-11-14 DIAGNOSIS — W19XXXA Unspecified fall, initial encounter: Secondary | ICD-10-CM | POA: Diagnosis not present

## 2020-11-18 DIAGNOSIS — S79912A Unspecified injury of left hip, initial encounter: Secondary | ICD-10-CM | POA: Diagnosis not present

## 2020-11-18 DIAGNOSIS — W1830XA Fall on same level, unspecified, initial encounter: Secondary | ICD-10-CM | POA: Diagnosis not present

## 2020-11-18 DIAGNOSIS — M25552 Pain in left hip: Secondary | ICD-10-CM | POA: Diagnosis not present

## 2020-11-19 DIAGNOSIS — Z8673 Personal history of transient ischemic attack (TIA), and cerebral infarction without residual deficits: Secondary | ICD-10-CM | POA: Diagnosis not present

## 2020-11-19 DIAGNOSIS — K219 Gastro-esophageal reflux disease without esophagitis: Secondary | ICD-10-CM | POA: Diagnosis not present

## 2020-11-19 DIAGNOSIS — E785 Hyperlipidemia, unspecified: Secondary | ICD-10-CM | POA: Diagnosis not present

## 2020-11-19 DIAGNOSIS — Z043 Encounter for examination and observation following other accident: Secondary | ICD-10-CM | POA: Diagnosis not present

## 2020-11-19 DIAGNOSIS — Z91048 Other nonmedicinal substance allergy status: Secondary | ICD-10-CM | POA: Diagnosis not present

## 2020-11-19 DIAGNOSIS — Z9151 Personal history of suicidal behavior: Secondary | ICD-10-CM | POA: Diagnosis not present

## 2020-11-19 DIAGNOSIS — Z7983 Long term (current) use of bisphosphonates: Secondary | ICD-10-CM | POA: Diagnosis not present

## 2020-11-19 DIAGNOSIS — I10 Essential (primary) hypertension: Secondary | ICD-10-CM | POA: Diagnosis not present

## 2020-11-19 DIAGNOSIS — R279 Unspecified lack of coordination: Secondary | ICD-10-CM | POA: Diagnosis not present

## 2020-11-19 DIAGNOSIS — M25551 Pain in right hip: Secondary | ICD-10-CM | POA: Diagnosis not present

## 2020-11-19 DIAGNOSIS — Z87891 Personal history of nicotine dependence: Secondary | ICD-10-CM | POA: Diagnosis not present

## 2020-11-19 DIAGNOSIS — Z79899 Other long term (current) drug therapy: Secondary | ICD-10-CM | POA: Diagnosis not present

## 2020-11-19 DIAGNOSIS — R404 Transient alteration of awareness: Secondary | ICD-10-CM | POA: Diagnosis not present

## 2020-11-19 DIAGNOSIS — Z9049 Acquired absence of other specified parts of digestive tract: Secondary | ICD-10-CM | POA: Diagnosis not present

## 2020-11-19 DIAGNOSIS — S0990XA Unspecified injury of head, initial encounter: Secondary | ICD-10-CM | POA: Diagnosis not present

## 2020-11-19 DIAGNOSIS — Z7902 Long term (current) use of antithrombotics/antiplatelets: Secondary | ICD-10-CM | POA: Diagnosis not present

## 2020-11-19 DIAGNOSIS — Z743 Need for continuous supervision: Secondary | ICD-10-CM | POA: Diagnosis not present

## 2020-11-19 DIAGNOSIS — Z888 Allergy status to other drugs, medicaments and biological substances status: Secondary | ICD-10-CM | POA: Diagnosis not present

## 2020-11-19 DIAGNOSIS — R6889 Other general symptoms and signs: Secondary | ICD-10-CM | POA: Diagnosis not present

## 2020-11-21 DIAGNOSIS — R0789 Other chest pain: Secondary | ICD-10-CM | POA: Diagnosis not present

## 2020-11-21 DIAGNOSIS — G301 Alzheimer's disease with late onset: Secondary | ICD-10-CM | POA: Diagnosis not present

## 2020-11-21 DIAGNOSIS — I10 Essential (primary) hypertension: Secondary | ICD-10-CM | POA: Diagnosis not present

## 2020-11-30 DIAGNOSIS — E039 Hypothyroidism, unspecified: Secondary | ICD-10-CM | POA: Diagnosis not present

## 2020-12-09 DIAGNOSIS — R918 Other nonspecific abnormal finding of lung field: Secondary | ICD-10-CM | POA: Diagnosis not present

## 2020-12-12 DIAGNOSIS — I10 Essential (primary) hypertension: Secondary | ICD-10-CM | POA: Diagnosis not present

## 2020-12-12 DIAGNOSIS — G301 Alzheimer's disease with late onset: Secondary | ICD-10-CM | POA: Diagnosis not present

## 2020-12-14 DIAGNOSIS — E785 Hyperlipidemia, unspecified: Secondary | ICD-10-CM | POA: Diagnosis not present

## 2020-12-14 DIAGNOSIS — Z9049 Acquired absence of other specified parts of digestive tract: Secondary | ICD-10-CM | POA: Diagnosis not present

## 2020-12-14 DIAGNOSIS — N189 Chronic kidney disease, unspecified: Secondary | ICD-10-CM | POA: Diagnosis not present

## 2020-12-14 DIAGNOSIS — Z87891 Personal history of nicotine dependence: Secondary | ICD-10-CM | POA: Diagnosis not present

## 2020-12-14 DIAGNOSIS — I69351 Hemiplegia and hemiparesis following cerebral infarction affecting right dominant side: Secondary | ICD-10-CM | POA: Diagnosis not present

## 2020-12-14 DIAGNOSIS — M810 Age-related osteoporosis without current pathological fracture: Secondary | ICD-10-CM | POA: Diagnosis not present

## 2020-12-14 DIAGNOSIS — K219 Gastro-esophageal reflux disease without esophagitis: Secondary | ICD-10-CM | POA: Diagnosis not present

## 2020-12-14 DIAGNOSIS — I129 Hypertensive chronic kidney disease with stage 1 through stage 4 chronic kidney disease, or unspecified chronic kidney disease: Secondary | ICD-10-CM | POA: Diagnosis not present

## 2020-12-14 DIAGNOSIS — G309 Alzheimer's disease, unspecified: Secondary | ICD-10-CM | POA: Diagnosis not present

## 2020-12-14 DIAGNOSIS — J439 Emphysema, unspecified: Secondary | ICD-10-CM | POA: Diagnosis not present

## 2020-12-14 DIAGNOSIS — Z7902 Long term (current) use of antithrombotics/antiplatelets: Secondary | ICD-10-CM | POA: Diagnosis not present

## 2020-12-14 DIAGNOSIS — G629 Polyneuropathy, unspecified: Secondary | ICD-10-CM | POA: Diagnosis not present

## 2020-12-14 DIAGNOSIS — Z9181 History of falling: Secondary | ICD-10-CM | POA: Diagnosis not present

## 2020-12-14 DIAGNOSIS — I69320 Aphasia following cerebral infarction: Secondary | ICD-10-CM | POA: Diagnosis not present

## 2020-12-16 DIAGNOSIS — Z888 Allergy status to other drugs, medicaments and biological substances status: Secondary | ICD-10-CM | POA: Diagnosis not present

## 2020-12-16 DIAGNOSIS — Z91048 Other nonmedicinal substance allergy status: Secondary | ICD-10-CM | POA: Diagnosis not present

## 2020-12-16 DIAGNOSIS — E785 Hyperlipidemia, unspecified: Secondary | ICD-10-CM | POA: Diagnosis not present

## 2020-12-16 DIAGNOSIS — R9431 Abnormal electrocardiogram [ECG] [EKG]: Secondary | ICD-10-CM | POA: Diagnosis not present

## 2020-12-16 DIAGNOSIS — W1830XA Fall on same level, unspecified, initial encounter: Secondary | ICD-10-CM | POA: Diagnosis not present

## 2020-12-16 DIAGNOSIS — M549 Dorsalgia, unspecified: Secondary | ICD-10-CM | POA: Diagnosis not present

## 2020-12-16 DIAGNOSIS — Z8673 Personal history of transient ischemic attack (TIA), and cerebral infarction without residual deficits: Secondary | ICD-10-CM | POA: Diagnosis not present

## 2020-12-16 DIAGNOSIS — Z7983 Long term (current) use of bisphosphonates: Secondary | ICD-10-CM | POA: Diagnosis not present

## 2020-12-16 DIAGNOSIS — K219 Gastro-esophageal reflux disease without esophagitis: Secondary | ICD-10-CM | POA: Diagnosis not present

## 2020-12-16 DIAGNOSIS — M1990 Unspecified osteoarthritis, unspecified site: Secondary | ICD-10-CM | POA: Diagnosis not present

## 2020-12-16 DIAGNOSIS — Z87891 Personal history of nicotine dependence: Secondary | ICD-10-CM | POA: Diagnosis not present

## 2020-12-16 DIAGNOSIS — Z79899 Other long term (current) drug therapy: Secondary | ICD-10-CM | POA: Diagnosis not present

## 2020-12-16 DIAGNOSIS — I2510 Atherosclerotic heart disease of native coronary artery without angina pectoris: Secondary | ICD-10-CM | POA: Diagnosis not present

## 2020-12-16 DIAGNOSIS — Z043 Encounter for examination and observation following other accident: Secondary | ICD-10-CM | POA: Diagnosis not present

## 2020-12-16 DIAGNOSIS — M47892 Other spondylosis, cervical region: Secondary | ICD-10-CM | POA: Diagnosis not present

## 2020-12-16 DIAGNOSIS — M47894 Other spondylosis, thoracic region: Secondary | ICD-10-CM | POA: Diagnosis not present

## 2020-12-16 DIAGNOSIS — S0990XA Unspecified injury of head, initial encounter: Secondary | ICD-10-CM | POA: Diagnosis not present

## 2020-12-16 DIAGNOSIS — K7689 Other specified diseases of liver: Secondary | ICD-10-CM | POA: Diagnosis not present

## 2020-12-16 DIAGNOSIS — S2231XA Fracture of one rib, right side, initial encounter for closed fracture: Secondary | ICD-10-CM | POA: Diagnosis not present

## 2020-12-16 DIAGNOSIS — M47896 Other spondylosis, lumbar region: Secondary | ICD-10-CM | POA: Diagnosis not present

## 2020-12-16 DIAGNOSIS — I10 Essential (primary) hypertension: Secondary | ICD-10-CM | POA: Diagnosis not present

## 2020-12-16 DIAGNOSIS — Z7902 Long term (current) use of antithrombotics/antiplatelets: Secondary | ICD-10-CM | POA: Diagnosis not present

## 2020-12-16 DIAGNOSIS — S2241XA Multiple fractures of ribs, right side, initial encounter for closed fracture: Secondary | ICD-10-CM | POA: Diagnosis not present

## 2020-12-18 DIAGNOSIS — I129 Hypertensive chronic kidney disease with stage 1 through stage 4 chronic kidney disease, or unspecified chronic kidney disease: Secondary | ICD-10-CM | POA: Diagnosis not present

## 2020-12-18 DIAGNOSIS — K219 Gastro-esophageal reflux disease without esophagitis: Secondary | ICD-10-CM | POA: Diagnosis not present

## 2020-12-18 DIAGNOSIS — I69320 Aphasia following cerebral infarction: Secondary | ICD-10-CM | POA: Diagnosis not present

## 2020-12-18 DIAGNOSIS — Z9049 Acquired absence of other specified parts of digestive tract: Secondary | ICD-10-CM | POA: Diagnosis not present

## 2020-12-18 DIAGNOSIS — Z87891 Personal history of nicotine dependence: Secondary | ICD-10-CM | POA: Diagnosis not present

## 2020-12-18 DIAGNOSIS — Z7902 Long term (current) use of antithrombotics/antiplatelets: Secondary | ICD-10-CM | POA: Diagnosis not present

## 2020-12-18 DIAGNOSIS — I69351 Hemiplegia and hemiparesis following cerebral infarction affecting right dominant side: Secondary | ICD-10-CM | POA: Diagnosis not present

## 2020-12-18 DIAGNOSIS — G309 Alzheimer's disease, unspecified: Secondary | ICD-10-CM | POA: Diagnosis not present

## 2020-12-18 DIAGNOSIS — J439 Emphysema, unspecified: Secondary | ICD-10-CM | POA: Diagnosis not present

## 2020-12-18 DIAGNOSIS — G629 Polyneuropathy, unspecified: Secondary | ICD-10-CM | POA: Diagnosis not present

## 2020-12-18 DIAGNOSIS — Z9181 History of falling: Secondary | ICD-10-CM | POA: Diagnosis not present

## 2020-12-18 DIAGNOSIS — E785 Hyperlipidemia, unspecified: Secondary | ICD-10-CM | POA: Diagnosis not present

## 2020-12-18 DIAGNOSIS — N189 Chronic kidney disease, unspecified: Secondary | ICD-10-CM | POA: Diagnosis not present

## 2020-12-18 DIAGNOSIS — M810 Age-related osteoporosis without current pathological fracture: Secondary | ICD-10-CM | POA: Diagnosis not present

## 2020-12-19 DIAGNOSIS — W19XXXA Unspecified fall, initial encounter: Secondary | ICD-10-CM | POA: Diagnosis not present

## 2020-12-19 DIAGNOSIS — R0781 Pleurodynia: Secondary | ICD-10-CM | POA: Diagnosis not present

## 2020-12-19 DIAGNOSIS — I639 Cerebral infarction, unspecified: Secondary | ICD-10-CM | POA: Diagnosis not present

## 2020-12-19 DIAGNOSIS — G301 Alzheimer's disease with late onset: Secondary | ICD-10-CM | POA: Diagnosis not present

## 2020-12-21 DIAGNOSIS — J439 Emphysema, unspecified: Secondary | ICD-10-CM | POA: Diagnosis not present

## 2020-12-21 DIAGNOSIS — Z7902 Long term (current) use of antithrombotics/antiplatelets: Secondary | ICD-10-CM | POA: Diagnosis not present

## 2020-12-21 DIAGNOSIS — Z87891 Personal history of nicotine dependence: Secondary | ICD-10-CM | POA: Diagnosis not present

## 2020-12-21 DIAGNOSIS — I69351 Hemiplegia and hemiparesis following cerebral infarction affecting right dominant side: Secondary | ICD-10-CM | POA: Diagnosis not present

## 2020-12-21 DIAGNOSIS — G629 Polyneuropathy, unspecified: Secondary | ICD-10-CM | POA: Diagnosis not present

## 2020-12-21 DIAGNOSIS — E785 Hyperlipidemia, unspecified: Secondary | ICD-10-CM | POA: Diagnosis not present

## 2020-12-21 DIAGNOSIS — Z9049 Acquired absence of other specified parts of digestive tract: Secondary | ICD-10-CM | POA: Diagnosis not present

## 2020-12-21 DIAGNOSIS — I129 Hypertensive chronic kidney disease with stage 1 through stage 4 chronic kidney disease, or unspecified chronic kidney disease: Secondary | ICD-10-CM | POA: Diagnosis not present

## 2020-12-21 DIAGNOSIS — G309 Alzheimer's disease, unspecified: Secondary | ICD-10-CM | POA: Diagnosis not present

## 2020-12-21 DIAGNOSIS — M810 Age-related osteoporosis without current pathological fracture: Secondary | ICD-10-CM | POA: Diagnosis not present

## 2020-12-21 DIAGNOSIS — N189 Chronic kidney disease, unspecified: Secondary | ICD-10-CM | POA: Diagnosis not present

## 2020-12-21 DIAGNOSIS — I69320 Aphasia following cerebral infarction: Secondary | ICD-10-CM | POA: Diagnosis not present

## 2020-12-21 DIAGNOSIS — K219 Gastro-esophageal reflux disease without esophagitis: Secondary | ICD-10-CM | POA: Diagnosis not present

## 2020-12-21 DIAGNOSIS — Z9181 History of falling: Secondary | ICD-10-CM | POA: Diagnosis not present

## 2020-12-25 DIAGNOSIS — I69320 Aphasia following cerebral infarction: Secondary | ICD-10-CM | POA: Diagnosis not present

## 2020-12-25 DIAGNOSIS — Z7902 Long term (current) use of antithrombotics/antiplatelets: Secondary | ICD-10-CM | POA: Diagnosis not present

## 2020-12-25 DIAGNOSIS — Z9049 Acquired absence of other specified parts of digestive tract: Secondary | ICD-10-CM | POA: Diagnosis not present

## 2020-12-25 DIAGNOSIS — N189 Chronic kidney disease, unspecified: Secondary | ICD-10-CM | POA: Diagnosis not present

## 2020-12-25 DIAGNOSIS — E785 Hyperlipidemia, unspecified: Secondary | ICD-10-CM | POA: Diagnosis not present

## 2020-12-25 DIAGNOSIS — I69351 Hemiplegia and hemiparesis following cerebral infarction affecting right dominant side: Secondary | ICD-10-CM | POA: Diagnosis not present

## 2020-12-25 DIAGNOSIS — G309 Alzheimer's disease, unspecified: Secondary | ICD-10-CM | POA: Diagnosis not present

## 2020-12-25 DIAGNOSIS — Z87891 Personal history of nicotine dependence: Secondary | ICD-10-CM | POA: Diagnosis not present

## 2020-12-25 DIAGNOSIS — M810 Age-related osteoporosis without current pathological fracture: Secondary | ICD-10-CM | POA: Diagnosis not present

## 2020-12-25 DIAGNOSIS — K219 Gastro-esophageal reflux disease without esophagitis: Secondary | ICD-10-CM | POA: Diagnosis not present

## 2020-12-25 DIAGNOSIS — J439 Emphysema, unspecified: Secondary | ICD-10-CM | POA: Diagnosis not present

## 2020-12-25 DIAGNOSIS — G629 Polyneuropathy, unspecified: Secondary | ICD-10-CM | POA: Diagnosis not present

## 2020-12-25 DIAGNOSIS — I129 Hypertensive chronic kidney disease with stage 1 through stage 4 chronic kidney disease, or unspecified chronic kidney disease: Secondary | ICD-10-CM | POA: Diagnosis not present

## 2020-12-25 DIAGNOSIS — Z9181 History of falling: Secondary | ICD-10-CM | POA: Diagnosis not present

## 2020-12-26 DIAGNOSIS — E039 Hypothyroidism, unspecified: Secondary | ICD-10-CM | POA: Diagnosis not present

## 2020-12-27 DIAGNOSIS — Z7902 Long term (current) use of antithrombotics/antiplatelets: Secondary | ICD-10-CM | POA: Diagnosis not present

## 2020-12-27 DIAGNOSIS — G309 Alzheimer's disease, unspecified: Secondary | ICD-10-CM | POA: Diagnosis not present

## 2020-12-27 DIAGNOSIS — Z9181 History of falling: Secondary | ICD-10-CM | POA: Diagnosis not present

## 2020-12-27 DIAGNOSIS — I69320 Aphasia following cerebral infarction: Secondary | ICD-10-CM | POA: Diagnosis not present

## 2020-12-27 DIAGNOSIS — J439 Emphysema, unspecified: Secondary | ICD-10-CM | POA: Diagnosis not present

## 2020-12-27 DIAGNOSIS — K219 Gastro-esophageal reflux disease without esophagitis: Secondary | ICD-10-CM | POA: Diagnosis not present

## 2020-12-27 DIAGNOSIS — Z87891 Personal history of nicotine dependence: Secondary | ICD-10-CM | POA: Diagnosis not present

## 2020-12-27 DIAGNOSIS — N189 Chronic kidney disease, unspecified: Secondary | ICD-10-CM | POA: Diagnosis not present

## 2020-12-27 DIAGNOSIS — G629 Polyneuropathy, unspecified: Secondary | ICD-10-CM | POA: Diagnosis not present

## 2020-12-27 DIAGNOSIS — I129 Hypertensive chronic kidney disease with stage 1 through stage 4 chronic kidney disease, or unspecified chronic kidney disease: Secondary | ICD-10-CM | POA: Diagnosis not present

## 2020-12-27 DIAGNOSIS — Z9049 Acquired absence of other specified parts of digestive tract: Secondary | ICD-10-CM | POA: Diagnosis not present

## 2020-12-27 DIAGNOSIS — E785 Hyperlipidemia, unspecified: Secondary | ICD-10-CM | POA: Diagnosis not present

## 2020-12-27 DIAGNOSIS — I69351 Hemiplegia and hemiparesis following cerebral infarction affecting right dominant side: Secondary | ICD-10-CM | POA: Diagnosis not present

## 2020-12-27 DIAGNOSIS — M810 Age-related osteoporosis without current pathological fracture: Secondary | ICD-10-CM | POA: Diagnosis not present

## 2020-12-28 DIAGNOSIS — N189 Chronic kidney disease, unspecified: Secondary | ICD-10-CM | POA: Diagnosis not present

## 2020-12-28 DIAGNOSIS — Z9049 Acquired absence of other specified parts of digestive tract: Secondary | ICD-10-CM | POA: Diagnosis not present

## 2020-12-28 DIAGNOSIS — I69351 Hemiplegia and hemiparesis following cerebral infarction affecting right dominant side: Secondary | ICD-10-CM | POA: Diagnosis not present

## 2020-12-28 DIAGNOSIS — Z7902 Long term (current) use of antithrombotics/antiplatelets: Secondary | ICD-10-CM | POA: Diagnosis not present

## 2020-12-28 DIAGNOSIS — G629 Polyneuropathy, unspecified: Secondary | ICD-10-CM | POA: Diagnosis not present

## 2020-12-28 DIAGNOSIS — Z9181 History of falling: Secondary | ICD-10-CM | POA: Diagnosis not present

## 2020-12-28 DIAGNOSIS — K219 Gastro-esophageal reflux disease without esophagitis: Secondary | ICD-10-CM | POA: Diagnosis not present

## 2020-12-28 DIAGNOSIS — Z87891 Personal history of nicotine dependence: Secondary | ICD-10-CM | POA: Diagnosis not present

## 2020-12-28 DIAGNOSIS — I129 Hypertensive chronic kidney disease with stage 1 through stage 4 chronic kidney disease, or unspecified chronic kidney disease: Secondary | ICD-10-CM | POA: Diagnosis not present

## 2020-12-28 DIAGNOSIS — R569 Unspecified convulsions: Secondary | ICD-10-CM | POA: Diagnosis not present

## 2020-12-28 DIAGNOSIS — I69320 Aphasia following cerebral infarction: Secondary | ICD-10-CM | POA: Diagnosis not present

## 2020-12-28 DIAGNOSIS — M810 Age-related osteoporosis without current pathological fracture: Secondary | ICD-10-CM | POA: Diagnosis not present

## 2020-12-28 DIAGNOSIS — J439 Emphysema, unspecified: Secondary | ICD-10-CM | POA: Diagnosis not present

## 2020-12-28 DIAGNOSIS — I679 Cerebrovascular disease, unspecified: Secondary | ICD-10-CM | POA: Diagnosis not present

## 2020-12-28 DIAGNOSIS — E785 Hyperlipidemia, unspecified: Secondary | ICD-10-CM | POA: Diagnosis not present

## 2020-12-28 DIAGNOSIS — G309 Alzheimer's disease, unspecified: Secondary | ICD-10-CM | POA: Diagnosis not present

## 2020-12-28 DIAGNOSIS — R296 Repeated falls: Secondary | ICD-10-CM | POA: Diagnosis not present

## 2021-01-01 DIAGNOSIS — I69320 Aphasia following cerebral infarction: Secondary | ICD-10-CM | POA: Diagnosis not present

## 2021-01-01 DIAGNOSIS — G309 Alzheimer's disease, unspecified: Secondary | ICD-10-CM | POA: Diagnosis not present

## 2021-01-01 DIAGNOSIS — Z9181 History of falling: Secondary | ICD-10-CM | POA: Diagnosis not present

## 2021-01-01 DIAGNOSIS — I69351 Hemiplegia and hemiparesis following cerebral infarction affecting right dominant side: Secondary | ICD-10-CM | POA: Diagnosis not present

## 2021-01-01 DIAGNOSIS — G629 Polyneuropathy, unspecified: Secondary | ICD-10-CM | POA: Diagnosis not present

## 2021-01-01 DIAGNOSIS — Z1152 Encounter for screening for COVID-19: Secondary | ICD-10-CM | POA: Diagnosis not present

## 2021-01-01 DIAGNOSIS — M810 Age-related osteoporosis without current pathological fracture: Secondary | ICD-10-CM | POA: Diagnosis not present

## 2021-01-01 DIAGNOSIS — Z9049 Acquired absence of other specified parts of digestive tract: Secondary | ICD-10-CM | POA: Diagnosis not present

## 2021-01-01 DIAGNOSIS — Z7902 Long term (current) use of antithrombotics/antiplatelets: Secondary | ICD-10-CM | POA: Diagnosis not present

## 2021-01-01 DIAGNOSIS — J439 Emphysema, unspecified: Secondary | ICD-10-CM | POA: Diagnosis not present

## 2021-01-01 DIAGNOSIS — Z87891 Personal history of nicotine dependence: Secondary | ICD-10-CM | POA: Diagnosis not present

## 2021-01-01 DIAGNOSIS — I129 Hypertensive chronic kidney disease with stage 1 through stage 4 chronic kidney disease, or unspecified chronic kidney disease: Secondary | ICD-10-CM | POA: Diagnosis not present

## 2021-01-01 DIAGNOSIS — K219 Gastro-esophageal reflux disease without esophagitis: Secondary | ICD-10-CM | POA: Diagnosis not present

## 2021-01-01 DIAGNOSIS — E785 Hyperlipidemia, unspecified: Secondary | ICD-10-CM | POA: Diagnosis not present

## 2021-01-01 DIAGNOSIS — N189 Chronic kidney disease, unspecified: Secondary | ICD-10-CM | POA: Diagnosis not present

## 2021-01-02 DIAGNOSIS — I69320 Aphasia following cerebral infarction: Secondary | ICD-10-CM | POA: Diagnosis not present

## 2021-01-02 DIAGNOSIS — R638 Other symptoms and signs concerning food and fluid intake: Secondary | ICD-10-CM | POA: Diagnosis not present

## 2021-01-02 DIAGNOSIS — G309 Alzheimer's disease, unspecified: Secondary | ICD-10-CM | POA: Diagnosis not present

## 2021-01-02 DIAGNOSIS — I679 Cerebrovascular disease, unspecified: Secondary | ICD-10-CM | POA: Diagnosis not present

## 2021-01-02 DIAGNOSIS — R634 Abnormal weight loss: Secondary | ICD-10-CM | POA: Diagnosis not present

## 2021-01-03 DIAGNOSIS — I129 Hypertensive chronic kidney disease with stage 1 through stage 4 chronic kidney disease, or unspecified chronic kidney disease: Secondary | ICD-10-CM | POA: Diagnosis not present

## 2021-01-03 DIAGNOSIS — I69351 Hemiplegia and hemiparesis following cerebral infarction affecting right dominant side: Secondary | ICD-10-CM | POA: Diagnosis not present

## 2021-01-03 DIAGNOSIS — N189 Chronic kidney disease, unspecified: Secondary | ICD-10-CM | POA: Diagnosis not present

## 2021-01-03 DIAGNOSIS — E785 Hyperlipidemia, unspecified: Secondary | ICD-10-CM | POA: Diagnosis not present

## 2021-01-03 DIAGNOSIS — J439 Emphysema, unspecified: Secondary | ICD-10-CM | POA: Diagnosis not present

## 2021-01-03 DIAGNOSIS — G629 Polyneuropathy, unspecified: Secondary | ICD-10-CM | POA: Diagnosis not present

## 2021-01-03 DIAGNOSIS — M810 Age-related osteoporosis without current pathological fracture: Secondary | ICD-10-CM | POA: Diagnosis not present

## 2021-01-03 DIAGNOSIS — Z7902 Long term (current) use of antithrombotics/antiplatelets: Secondary | ICD-10-CM | POA: Diagnosis not present

## 2021-01-03 DIAGNOSIS — Z9049 Acquired absence of other specified parts of digestive tract: Secondary | ICD-10-CM | POA: Diagnosis not present

## 2021-01-03 DIAGNOSIS — I69320 Aphasia following cerebral infarction: Secondary | ICD-10-CM | POA: Diagnosis not present

## 2021-01-03 DIAGNOSIS — Z87891 Personal history of nicotine dependence: Secondary | ICD-10-CM | POA: Diagnosis not present

## 2021-01-03 DIAGNOSIS — Z9181 History of falling: Secondary | ICD-10-CM | POA: Diagnosis not present

## 2021-01-03 DIAGNOSIS — K219 Gastro-esophageal reflux disease without esophagitis: Secondary | ICD-10-CM | POA: Diagnosis not present

## 2021-01-03 DIAGNOSIS — G309 Alzheimer's disease, unspecified: Secondary | ICD-10-CM | POA: Diagnosis not present

## 2021-01-04 DIAGNOSIS — Z03818 Encounter for observation for suspected exposure to other biological agents ruled out: Secondary | ICD-10-CM | POA: Diagnosis not present

## 2021-01-08 DIAGNOSIS — Z03818 Encounter for observation for suspected exposure to other biological agents ruled out: Secondary | ICD-10-CM | POA: Diagnosis not present

## 2021-01-08 DIAGNOSIS — K219 Gastro-esophageal reflux disease without esophagitis: Secondary | ICD-10-CM | POA: Diagnosis not present

## 2021-01-08 DIAGNOSIS — I129 Hypertensive chronic kidney disease with stage 1 through stage 4 chronic kidney disease, or unspecified chronic kidney disease: Secondary | ICD-10-CM | POA: Diagnosis not present

## 2021-01-08 DIAGNOSIS — Z7902 Long term (current) use of antithrombotics/antiplatelets: Secondary | ICD-10-CM | POA: Diagnosis not present

## 2021-01-08 DIAGNOSIS — Z87891 Personal history of nicotine dependence: Secondary | ICD-10-CM | POA: Diagnosis not present

## 2021-01-08 DIAGNOSIS — Z9181 History of falling: Secondary | ICD-10-CM | POA: Diagnosis not present

## 2021-01-08 DIAGNOSIS — G309 Alzheimer's disease, unspecified: Secondary | ICD-10-CM | POA: Diagnosis not present

## 2021-01-08 DIAGNOSIS — I69351 Hemiplegia and hemiparesis following cerebral infarction affecting right dominant side: Secondary | ICD-10-CM | POA: Diagnosis not present

## 2021-01-08 DIAGNOSIS — N189 Chronic kidney disease, unspecified: Secondary | ICD-10-CM | POA: Diagnosis not present

## 2021-01-08 DIAGNOSIS — J439 Emphysema, unspecified: Secondary | ICD-10-CM | POA: Diagnosis not present

## 2021-01-08 DIAGNOSIS — G629 Polyneuropathy, unspecified: Secondary | ICD-10-CM | POA: Diagnosis not present

## 2021-01-08 DIAGNOSIS — E785 Hyperlipidemia, unspecified: Secondary | ICD-10-CM | POA: Diagnosis not present

## 2021-01-08 DIAGNOSIS — Z9049 Acquired absence of other specified parts of digestive tract: Secondary | ICD-10-CM | POA: Diagnosis not present

## 2021-01-08 DIAGNOSIS — M810 Age-related osteoporosis without current pathological fracture: Secondary | ICD-10-CM | POA: Diagnosis not present

## 2021-01-08 DIAGNOSIS — I69320 Aphasia following cerebral infarction: Secondary | ICD-10-CM | POA: Diagnosis not present

## 2021-01-10 DIAGNOSIS — K219 Gastro-esophageal reflux disease without esophagitis: Secondary | ICD-10-CM | POA: Diagnosis not present

## 2021-01-10 DIAGNOSIS — Z9181 History of falling: Secondary | ICD-10-CM | POA: Diagnosis not present

## 2021-01-10 DIAGNOSIS — Z87891 Personal history of nicotine dependence: Secondary | ICD-10-CM | POA: Diagnosis not present

## 2021-01-10 DIAGNOSIS — I129 Hypertensive chronic kidney disease with stage 1 through stage 4 chronic kidney disease, or unspecified chronic kidney disease: Secondary | ICD-10-CM | POA: Diagnosis not present

## 2021-01-10 DIAGNOSIS — J439 Emphysema, unspecified: Secondary | ICD-10-CM | POA: Diagnosis not present

## 2021-01-10 DIAGNOSIS — I69351 Hemiplegia and hemiparesis following cerebral infarction affecting right dominant side: Secondary | ICD-10-CM | POA: Diagnosis not present

## 2021-01-10 DIAGNOSIS — Z9049 Acquired absence of other specified parts of digestive tract: Secondary | ICD-10-CM | POA: Diagnosis not present

## 2021-01-10 DIAGNOSIS — M810 Age-related osteoporosis without current pathological fracture: Secondary | ICD-10-CM | POA: Diagnosis not present

## 2021-01-10 DIAGNOSIS — I69320 Aphasia following cerebral infarction: Secondary | ICD-10-CM | POA: Diagnosis not present

## 2021-01-10 DIAGNOSIS — G309 Alzheimer's disease, unspecified: Secondary | ICD-10-CM | POA: Diagnosis not present

## 2021-01-10 DIAGNOSIS — E785 Hyperlipidemia, unspecified: Secondary | ICD-10-CM | POA: Diagnosis not present

## 2021-01-10 DIAGNOSIS — N189 Chronic kidney disease, unspecified: Secondary | ICD-10-CM | POA: Diagnosis not present

## 2021-01-10 DIAGNOSIS — G629 Polyneuropathy, unspecified: Secondary | ICD-10-CM | POA: Diagnosis not present

## 2021-01-10 DIAGNOSIS — Z7902 Long term (current) use of antithrombotics/antiplatelets: Secondary | ICD-10-CM | POA: Diagnosis not present

## 2021-01-11 DIAGNOSIS — Z03818 Encounter for observation for suspected exposure to other biological agents ruled out: Secondary | ICD-10-CM | POA: Diagnosis not present

## 2021-01-13 DIAGNOSIS — I63411 Cerebral infarction due to embolism of right middle cerebral artery: Secondary | ICD-10-CM | POA: Diagnosis not present

## 2021-01-13 DIAGNOSIS — Z888 Allergy status to other drugs, medicaments and biological substances status: Secondary | ICD-10-CM | POA: Diagnosis not present

## 2021-01-13 DIAGNOSIS — J439 Emphysema, unspecified: Secondary | ICD-10-CM | POA: Diagnosis not present

## 2021-01-13 DIAGNOSIS — R0682 Tachypnea, not elsewhere classified: Secondary | ICD-10-CM | POA: Diagnosis not present

## 2021-01-13 DIAGNOSIS — I63233 Cerebral infarction due to unspecified occlusion or stenosis of bilateral carotid arteries: Secondary | ICD-10-CM | POA: Diagnosis not present

## 2021-01-13 DIAGNOSIS — Z8719 Personal history of other diseases of the digestive system: Secondary | ICD-10-CM | POA: Diagnosis not present

## 2021-01-13 DIAGNOSIS — I63031 Cerebral infarction due to thrombosis of right carotid artery: Secondary | ICD-10-CM | POA: Diagnosis not present

## 2021-01-13 DIAGNOSIS — M1991 Primary osteoarthritis, unspecified site: Secondary | ICD-10-CM | POA: Diagnosis not present

## 2021-01-13 DIAGNOSIS — I63511 Cerebral infarction due to unspecified occlusion or stenosis of right middle cerebral artery: Secondary | ICD-10-CM | POA: Diagnosis not present

## 2021-01-13 DIAGNOSIS — E785 Hyperlipidemia, unspecified: Secondary | ICD-10-CM | POA: Diagnosis not present

## 2021-01-13 DIAGNOSIS — I639 Cerebral infarction, unspecified: Secondary | ICD-10-CM | POA: Diagnosis not present

## 2021-01-13 DIAGNOSIS — R0600 Dyspnea, unspecified: Secondary | ICD-10-CM | POA: Diagnosis not present

## 2021-01-13 DIAGNOSIS — Z87442 Personal history of urinary calculi: Secondary | ICD-10-CM | POA: Diagnosis not present

## 2021-01-13 DIAGNOSIS — Z66 Do not resuscitate: Secondary | ICD-10-CM | POA: Diagnosis not present

## 2021-01-13 DIAGNOSIS — R918 Other nonspecific abnormal finding of lung field: Secondary | ICD-10-CM | POA: Diagnosis not present

## 2021-01-13 DIAGNOSIS — I10 Essential (primary) hypertension: Secondary | ICD-10-CM | POA: Diagnosis not present

## 2021-01-13 DIAGNOSIS — G2581 Restless legs syndrome: Secondary | ICD-10-CM | POA: Diagnosis not present

## 2021-01-13 DIAGNOSIS — R011 Cardiac murmur, unspecified: Secondary | ICD-10-CM | POA: Diagnosis not present

## 2021-01-13 DIAGNOSIS — Z515 Encounter for palliative care: Secondary | ICD-10-CM | POA: Diagnosis not present

## 2021-01-13 DIAGNOSIS — Z9109 Other allergy status, other than to drugs and biological substances: Secondary | ICD-10-CM | POA: Diagnosis not present

## 2021-01-13 DIAGNOSIS — G8194 Hemiplegia, unspecified affecting left nondominant side: Secondary | ICD-10-CM | POA: Diagnosis not present

## 2021-01-13 DIAGNOSIS — R0902 Hypoxemia: Secondary | ICD-10-CM | POA: Diagnosis not present

## 2021-01-13 DIAGNOSIS — Z20822 Contact with and (suspected) exposure to covid-19: Secondary | ICD-10-CM | POA: Diagnosis not present

## 2021-01-13 DIAGNOSIS — Z9049 Acquired absence of other specified parts of digestive tract: Secondary | ICD-10-CM | POA: Diagnosis not present

## 2021-01-13 DIAGNOSIS — I63512 Cerebral infarction due to unspecified occlusion or stenosis of left middle cerebral artery: Secondary | ICD-10-CM | POA: Diagnosis not present

## 2021-01-13 DIAGNOSIS — Z87891 Personal history of nicotine dependence: Secondary | ICD-10-CM | POA: Diagnosis not present

## 2021-01-13 DIAGNOSIS — R52 Pain, unspecified: Secondary | ICD-10-CM | POA: Diagnosis not present

## 2021-01-13 DIAGNOSIS — R531 Weakness: Secondary | ICD-10-CM | POA: Diagnosis not present

## 2021-01-13 DIAGNOSIS — K219 Gastro-esophageal reflux disease without esophagitis: Secondary | ICD-10-CM | POA: Diagnosis not present

## 2021-01-13 DIAGNOSIS — I69320 Aphasia following cerebral infarction: Secondary | ICD-10-CM | POA: Diagnosis not present

## 2021-01-30 DEATH — deceased
